# Patient Record
Sex: Female | Born: 1975 | Hispanic: Yes | Marital: Married | Smoking: Never smoker
Health system: Southern US, Community
[De-identification: ages and names within clinical notes are randomized; demographics above are authoritative.]

## PROBLEM LIST (undated history)

## (undated) DIAGNOSIS — Z789 Other specified health status: Secondary | ICD-10-CM

## (undated) DIAGNOSIS — L910 Hypertrophic scar: Secondary | ICD-10-CM

## (undated) DIAGNOSIS — O165 Unspecified maternal hypertension, complicating the puerperium: Secondary | ICD-10-CM

## (undated) HISTORY — DX: Hypertrophic scar: L91.0

## (undated) HISTORY — DX: Unspecified maternal hypertension, complicating the puerperium: O16.5

## (undated) HISTORY — DX: Other specified health status: Z78.9

---

## 2019-08-29 LAB — OB RESULTS CONSOLE ABO/RH: RH Type: POSITIVE

## 2019-08-29 LAB — HIV ANTIBODY (ROUTINE TESTING W REFLEX): HIV Screen 4th Generation wRfx: NONREACTIVE

## 2019-08-29 LAB — OB RESULTS CONSOLE RUBELLA ANTIBODY, IGM: Rubella: IMMUNE

## 2019-08-29 LAB — OB RESULTS CONSOLE HEPATITIS B SURFACE ANTIGEN: Hepatitis B Surface Ag: NEGATIVE

## 2019-08-29 LAB — OB RESULTS CONSOLE RPR: RPR: NONREACTIVE

## 2019-08-29 LAB — OB RESULTS CONSOLE GC/CHLAMYDIA
Chlamydia: NEGATIVE
Gonorrhea: NEGATIVE

## 2019-08-29 LAB — HEPATITIS C ANTIBODY: HCV Ab: NEGATIVE

## 2019-08-29 LAB — OB RESULTS CONSOLE ANTIBODY SCREEN: Antibody Screen: NEGATIVE

## 2020-01-30 ENCOUNTER — Encounter: Payer: Self-pay | Admitting: Radiology

## 2020-02-05 ENCOUNTER — Other Ambulatory Visit (HOSPITAL_COMMUNITY)
Admission: RE | Admit: 2020-02-05 | Discharge: 2020-02-05 | Disposition: A | Discharge status: Home / Self Care | Source: Ambulatory Visit | Attending: Family Medicine | Admitting: Family Medicine

## 2020-02-05 ENCOUNTER — Other Ambulatory Visit: Payer: Self-pay

## 2020-02-05 ENCOUNTER — Encounter: Payer: Self-pay | Admitting: Family Medicine

## 2020-02-05 ENCOUNTER — Ambulatory Visit (INDEPENDENT_AMBULATORY_CARE_PROVIDER_SITE_OTHER): Admitting: Family Medicine

## 2020-02-05 DIAGNOSIS — O09529 Supervision of elderly multigravida, unspecified trimester: Secondary | ICD-10-CM | POA: Insufficient documentation

## 2020-02-05 DIAGNOSIS — O0990 Supervision of high risk pregnancy, unspecified, unspecified trimester: Secondary | ICD-10-CM | POA: Insufficient documentation

## 2020-02-05 DIAGNOSIS — O09523 Supervision of elderly multigravida, third trimester: Secondary | ICD-10-CM

## 2020-02-05 DIAGNOSIS — D573 Sickle-cell trait: Secondary | ICD-10-CM | POA: Insufficient documentation

## 2020-02-05 DIAGNOSIS — O0993 Supervision of high risk pregnancy, unspecified, third trimester: Secondary | ICD-10-CM | POA: Diagnosis not present

## 2020-02-05 DIAGNOSIS — Z3A36 36 weeks gestation of pregnancy: Secondary | ICD-10-CM

## 2020-02-05 DIAGNOSIS — O34219 Maternal care for unspecified type scar from previous cesarean delivery: Secondary | ICD-10-CM | POA: Insufficient documentation

## 2020-02-05 NOTE — Progress Notes (Signed)
Desires repeat c section  Last saw OB on July 22nd Normal pap on 08/21/2019

## 2020-02-05 NOTE — Progress Notes (Signed)
° °  PRENATAL VISIT NOTE  Subjective:  Brianna Chapman is a 44 y.o. G3P1011 at [redacted]w[redacted]d being seen today for transferring prenatal care from [place].  She is currently monitored for the following issues for this high-risk pregnancy and has Supervision of high risk pregnancy, antepartum; Previous cesarean delivery affecting pregnancy, antepartum; and AMA (advanced maternal age) multigravida 35+ on their problem list.  Patient reports no complaints.  Contractions: Irritability. Vag. Bleeding: None.  Movement: Present. Denies leaking of fluid.   The following portions of the patient's history were reviewed and updated as appropriate: allergies, current medications, past family history, past medical history, past social history, past surgical history and problem list.   Objective:   Vitals:   02/05/20 1603 02/05/20 1608  BP: 101/64   Pulse: 71   Weight: 163 lb (73.9 kg)   Height:  5\' 4"  (1.626 m)    Fetal Status: Fetal Heart Rate (bpm): 141 Fundal Height: 32 cm Movement: Present  Presentation: Vertex  General:  Alert, oriented and cooperative. Patient is in no acute distress.  Skin: Skin is warm and dry. No rash noted.   Cardiovascular: Normal heart rate noted  Respiratory: Normal respiratory effort, no problems with respiration noted  Abdomen: Soft, gravid, appropriate for gestational age.  Pain/Pressure: Present     Pelvic: Cervical exam performed in the presence of a chaperone Dilation: Fingertip Effacement (%): 40 Station: -2  Extremities: Normal range of motion.  Edema: None  Mental Status: Normal mood and affect. Normal behavior. Normal judgment and thought content.   Assessment and Plan:  Pregnancy: G3P1011 at [redacted]w[redacted]d 1. Supervision of high risk pregnancy, antepartum Has no had care since 11/2019, no glucose screen--check A1C Cultures today Declined flu and COVID vaccines - Hemoglobin A1c - Strep Gp B NAA - Cervicovaginal ancillary only( [place])  2. Previous cesarean delivery  affecting pregnancy, antepartum Booked for RCS--unsure about contraception or pregnancy desires  3. Multigravida of advanced maternal age in third trimester nml NIPT  Preterm labor symptoms and general obstetric precautions including but not limited to vaginal bleeding, contractions, leaking of fluid and fetal movement were reviewed in detail with the patient. Please refer to After Visit Summary for other counseling recommendations.   No follow-ups on file.  Future Appointments  Date Time Provider Department Center  02/12/2020 11:15 AM 02/14/2020, MD [facility]    Reva Bores, MD

## 2020-02-06 ENCOUNTER — Other Ambulatory Visit: Payer: Self-pay | Admitting: Obstetrics and Gynecology

## 2020-02-06 ENCOUNTER — Telehealth: Payer: Self-pay

## 2020-02-06 LAB — HEMOGLOBIN A1C
Est. average glucose Bld gHb Est-mCnc: 120 mg/dL
Hgb A1c MFr Bld: 5.8 % — ABNORMAL HIGH (ref 4.8–5.6)

## 2020-02-06 NOTE — Telephone Encounter (Signed)
-----   Message from Reva Bores, MD sent at 02/06/2020  7:21 AM EDT ----- Her A1C is high--can we get her in for 2 hour asap

## 2020-02-06 NOTE — Telephone Encounter (Signed)
Pt. Informed of hemoglobin a1c. Pt. Scheduled for 2 hr GTT per Dr. Shawnie Pons. Pt. Voiced understanding.

## 2020-02-07 ENCOUNTER — Other Ambulatory Visit

## 2020-02-07 ENCOUNTER — Other Ambulatory Visit: Payer: Self-pay

## 2020-02-07 DIAGNOSIS — O0990 Supervision of high risk pregnancy, unspecified, unspecified trimester: Secondary | ICD-10-CM

## 2020-02-07 LAB — CERVICOVAGINAL ANCILLARY ONLY
Chlamydia: NEGATIVE
Comment: NEGATIVE
Comment: NORMAL
Neisseria Gonorrhea: NEGATIVE

## 2020-02-07 LAB — CBC
Hematocrit: 30.8 % — ABNORMAL LOW (ref 34.0–46.6)
Hemoglobin: 9.3 g/dL — ABNORMAL LOW (ref 11.1–15.9)
MCH: 22.9 pg — ABNORMAL LOW (ref 26.6–33.0)
MCHC: 30.2 g/dL — ABNORMAL LOW (ref 31.5–35.7)
MCV: 76 fL — ABNORMAL LOW (ref 79–97)
Platelets: 177 10*3/uL (ref 150–450)
RBC: 4.06 x10E6/uL (ref 3.77–5.28)
RDW: 16.9 % — ABNORMAL HIGH (ref 11.7–15.4)
WBC: 10 10*3/uL (ref 3.4–10.8)

## 2020-02-07 LAB — STREP GP B NAA: Strep Gp B NAA: NEGATIVE

## 2020-02-08 ENCOUNTER — Telehealth: Payer: Self-pay | Admitting: *Deleted

## 2020-02-08 LAB — HIV ANTIBODY (ROUTINE TESTING W REFLEX): HIV Screen 4th Generation wRfx: NONREACTIVE

## 2020-02-08 LAB — GLUCOSE TOLERANCE, 2 HOURS W/ 1HR
Glucose, 1 hour: 138 mg/dL (ref 65–179)
Glucose, 2 hour: 125 mg/dL (ref 65–152)
Glucose, Fasting: 81 mg/dL (ref 65–91)

## 2020-02-08 LAB — RPR: RPR Ser Ql: NONREACTIVE

## 2020-02-08 NOTE — Telephone Encounter (Signed)
Called pt to inform her of her lab results and recommendations from Dr Shawnie Pons. Pt verbalizes and understands.

## 2020-02-08 NOTE — Telephone Encounter (Signed)
-----   Message from Reva Bores, MD sent at 02/08/2020  8:41 AM EDT ----- Passed her sugar test, and needs iron for anemia--no pharmacy--take one 325 ferrous sulfate every other day

## 2020-02-11 ENCOUNTER — Encounter (HOSPITAL_COMMUNITY): Payer: Self-pay

## 2020-02-11 NOTE — Patient Instructions (Signed)
Brianna Chapman  02/11/2020   Your procedure is scheduled on:  02/25/2020  Arrive at 0745 at Entrance C on [address] at [facility]  and [employer]. You are invited to use the FREE valet parking or use the Visitor's parking deck.  Pick up the phone at the desk and dial [phone].  Call this number if you have problems the morning of surgery: [phone]  Remember:   Do not eat food:(After Midnight) Desps de medianoche.  Do not drink clear liquids: (After Midnight) Desps de medianoche.  Take these medicines the morning of surgery with A SIP OF WATER:  none   Do not wear jewelry, make-up or nail polish.  Do not wear lotions, powders, or perfumes. Do not wear deodorant.  Do not shave 48 hours prior to surgery.  Do not bring valuables to the hospital.  [facility] is not   responsible for any belongings or valuables brought to the hospital.  Contacts, dentures or bridgework may not be worn into surgery.  Leave suitcase in the car. After surgery it may be brought to your room.  For patients admitted to the hospital, checkout time is 11:00 AM the day of              discharge.      Please read over the following fact sheets that you were given:     Preparing for Surgery

## 2020-02-12 ENCOUNTER — Ambulatory Visit (INDEPENDENT_AMBULATORY_CARE_PROVIDER_SITE_OTHER): Admitting: Family Medicine

## 2020-02-12 ENCOUNTER — Other Ambulatory Visit: Payer: Self-pay

## 2020-02-12 VITALS — BP 110/67 | HR 68 | Wt 166.4 lb

## 2020-02-12 DIAGNOSIS — O34219 Maternal care for unspecified type scar from previous cesarean delivery: Secondary | ICD-10-CM

## 2020-02-12 DIAGNOSIS — O0990 Supervision of high risk pregnancy, unspecified, unspecified trimester: Secondary | ICD-10-CM

## 2020-02-12 DIAGNOSIS — O09523 Supervision of elderly multigravida, third trimester: Secondary | ICD-10-CM | POA: Diagnosis not present

## 2020-02-12 DIAGNOSIS — O0993 Supervision of high risk pregnancy, unspecified, third trimester: Secondary | ICD-10-CM | POA: Diagnosis not present

## 2020-02-12 DIAGNOSIS — Z3A37 37 weeks gestation of pregnancy: Secondary | ICD-10-CM | POA: Diagnosis not present

## 2020-02-12 NOTE — Patient Instructions (Signed)

## 2020-02-13 NOTE — Progress Notes (Signed)
   PRENATAL VISIT NOTE  Subjective:  Brianna Chapman is a 44 y.o. G3P1011 at [redacted]w[redacted]d being seen today for ongoing prenatal care.  She is currently monitored for the following issues for this high-risk pregnancy and has Supervision of high risk pregnancy, antepartum; Previous cesarean delivery affecting pregnancy, antepartum; AMA (advanced maternal age) multigravida 35+; and Sickle cell trait (HCC) on their problem list.  Patient reports no complaints.  Contractions: Not present. Vag. Bleeding: None.  Movement: Present. Denies leaking of fluid.   The following portions of the patient's history were reviewed and updated as appropriate: allergies, current medications, past family history, past medical history, past social history, past surgical history and problem list.   Objective:   Vitals:   02/12/20 1129  BP: 110/67  Pulse: 68  Weight: 166 lb 6.4 oz (75.5 kg)    Fetal Status: Fetal Heart Rate (bpm): 136 Fundal Height: 34 cm Movement: Present     General:  Alert, oriented and cooperative. Patient is in no acute distress.  Skin: Skin is warm and dry. No rash noted.   Cardiovascular: Normal heart rate noted  Respiratory: Normal respiratory effort, no problems with respiration noted  Abdomen: Soft, gravid, appropriate for gestational age.  Pain/Pressure: Absent     Pelvic: Cervical exam deferred        Extremities: Normal range of motion.  Edema: None  Mental Status: Normal mood and affect. Normal behavior. Normal judgment and thought content.   Assessment and Plan:  Pregnancy: G3P1011 at [redacted]w[redacted]d 1. Multigravida of advanced maternal age in third trimester Normal NIPT  2. Previous cesarean delivery affecting pregnancy, antepartum Scheduled for RCS  3. Supervision of high risk pregnancy, antepartum Continue routine prenatal care.  Term labor symptoms and general obstetric precautions including but not limited to vaginal bleeding, contractions, leaking of fluid and fetal movement were  reviewed in detail with the patient. Please refer to After Visit Summary for other counseling recommendations.   Return in 1 week (on 02/19/2020).  Future Appointments  Date Time Provider Department Center  02/19/2020  2:15 PM Anyanwu, Jethro Bastos, MD [facility]  02/23/2020  8:20 AM [facility] 1 [facility] None    Reva Bores, MD

## 2020-02-19 ENCOUNTER — Other Ambulatory Visit: Payer: Self-pay

## 2020-02-19 ENCOUNTER — Ambulatory Visit (INDEPENDENT_AMBULATORY_CARE_PROVIDER_SITE_OTHER): Admitting: Obstetrics & Gynecology

## 2020-02-19 ENCOUNTER — Encounter: Payer: Self-pay | Admitting: Obstetrics & Gynecology

## 2020-02-19 VITALS — BP 97/60 | HR 67 | Wt 165.8 lb

## 2020-02-19 DIAGNOSIS — Z3A38 38 weeks gestation of pregnancy: Secondary | ICD-10-CM

## 2020-02-19 DIAGNOSIS — O34219 Maternal care for unspecified type scar from previous cesarean delivery: Secondary | ICD-10-CM

## 2020-02-19 DIAGNOSIS — O09523 Supervision of elderly multigravida, third trimester: Secondary | ICD-10-CM | POA: Diagnosis not present

## 2020-02-19 DIAGNOSIS — O0993 Supervision of high risk pregnancy, unspecified, third trimester: Secondary | ICD-10-CM

## 2020-02-19 DIAGNOSIS — O0990 Supervision of high risk pregnancy, unspecified, unspecified trimester: Secondary | ICD-10-CM

## 2020-02-19 NOTE — Patient Instructions (Signed)
Return to office for any scheduled appointments. Call the office or go to the MAU at [facility] if:  You begin to have strong, frequent contractions  Your water breaks.  Sometimes it is a big gush of fluid, sometimes it is just a trickle that keeps getting your panties wet or running down your legs  You have vaginal bleeding.  It is normal to have a small amount of spotting if your cervix was checked.   You do not feel your baby moving like normal.  If you do not, get something to eat and drink and lay down and focus on feeling your baby move.   If your baby is still not moving like normal, you should call the office or go to MAU.  Any other obstetric concerns.   

## 2020-02-19 NOTE — Progress Notes (Signed)
   PRENATAL VISIT NOTE  Subjective:  Brianna Chapman is a 44 y.o. G3P1011 at [redacted]w[redacted]d being seen today for ongoing prenatal care.  She is currently monitored for the following issues for this high-risk pregnancy and has Supervision of high risk pregnancy, antepartum; Previous cesarean delivery affecting pregnancy, antepartum; AMA (advanced maternal age) multigravida 35+; and Sickle cell trait (HCC) on their problem list.  Patient reports no complaints.  Contractions: Not present. Vag. Bleeding: None.  Movement: Present. Denies leaking of fluid.   The following portions of the patient's history were reviewed and updated as appropriate: allergies, current medications, past family history, past medical history, past social history, past surgical history and problem list.   Objective:   Vitals:   02/19/20 1427  BP: 97/60  Pulse: 67  Weight: 165 lb 12.8 oz (75.2 kg)    Fetal Status: Fetal Heart Rate (bpm): 140 Fundal Height: 36 cm Movement: Present     General:  Alert, oriented and cooperative. Patient is in no acute distress.  Skin: Skin is warm and dry. No rash noted.   Cardiovascular: Normal heart rate noted  Respiratory: Normal respiratory effort, no problems with respiration noted  Abdomen: Soft, gravid, appropriate for gestational age.  Pain/Pressure: Absent     Pelvic: Cervical exam deferred        Extremities: Normal range of motion.  Edema: None  Mental Status: Normal mood and affect. Normal behavior. Normal judgment and thought content.   Assessment and Plan:  Pregnancy: G3P1011 at [redacted]w[redacted]d 1. Multigravida of advanced maternal age in third trimester Antenatal testing today.  NST performed today was reviewed and was found to be reactive.  AFI was also normal.   - Fetal nonstress test - US OB Limited; Future  2. Previous cesarean delivery affecting pregnancy, antepartum Already scheduled for RCS at 39 weeks  3. [redacted] weeks gestation of pregnancy 4. Supervision of high risk  pregnancy, antepartum No other concerns.  Term labor symptoms and general obstetric precautions including but not limited to vaginal bleeding, contractions, leaking of fluid and fetal movement were reviewed in detail with the patient. Please refer to After Visit Summary for other counseling recommendations.   Return in about 6 weeks (around 04/01/2020) for Postpartum check.  Future Appointments  Date Time Provider Department Center  02/23/2020  8:20 AM [facility] 1 [facility] None    Jaynie Collins, MD

## 2020-02-23 ENCOUNTER — Ambulatory Visit (HOSPITAL_COMMUNITY)
Admission: RE | Admit: 2020-02-23 | Discharge: 2020-02-23 | Disposition: A | Discharge status: Home / Self Care | Source: Ambulatory Visit | Attending: Obstetrics and Gynecology | Admitting: Obstetrics and Gynecology

## 2020-02-23 ENCOUNTER — Other Ambulatory Visit: Payer: Self-pay

## 2020-02-23 DIAGNOSIS — Z20822 Contact with and (suspected) exposure to covid-19: Secondary | ICD-10-CM | POA: Insufficient documentation

## 2020-02-23 LAB — CBC
HCT: 27.2 % — ABNORMAL LOW (ref 36.0–46.0)
Hemoglobin: 8.3 g/dL — ABNORMAL LOW (ref 12.0–15.0)
MCH: 22.2 pg — ABNORMAL LOW (ref 26.0–34.0)
MCHC: 30.5 g/dL (ref 30.0–36.0)
MCV: 72.7 fL — ABNORMAL LOW (ref 80.0–100.0)
Platelets: 168 10*3/uL (ref 150–400)
RBC: 3.74 MIL/uL — ABNORMAL LOW (ref 3.87–5.11)
RDW: 17.3 % — ABNORMAL HIGH (ref 11.5–15.5)
WBC: 8.5 10*3/uL (ref 4.0–10.5)
nRBC: 0 % (ref 0.0–0.2)

## 2020-02-23 LAB — RESPIRATORY PANEL BY RT PCR (FLU A&B, COVID)
Influenza A by PCR: NEGATIVE
Influenza B by PCR: NEGATIVE
SARS Coronavirus 2 by RT PCR: NEGATIVE

## 2020-02-23 LAB — RPR: RPR Ser Ql: NONREACTIVE

## 2020-02-25 ENCOUNTER — Encounter (HOSPITAL_COMMUNITY)
Admission: RE | Disposition: A | Discharge status: Home / Self Care | Payer: Self-pay | Source: Home / Self Care | Attending: Obstetrics and Gynecology

## 2020-02-25 ENCOUNTER — Inpatient Hospital Stay (HOSPITAL_COMMUNITY)
Admission: RE | Admit: 2020-02-25 | Discharge: 2020-02-27 | DRG: 788 | Disposition: A | Discharge status: Home / Self Care | Attending: Obstetrics and Gynecology | Admitting: Obstetrics and Gynecology

## 2020-02-25 ENCOUNTER — Encounter (HOSPITAL_COMMUNITY): Payer: Self-pay | Admitting: Obstetrics and Gynecology

## 2020-02-25 ENCOUNTER — Other Ambulatory Visit: Payer: Self-pay

## 2020-02-25 ENCOUNTER — Inpatient Hospital Stay (HOSPITAL_COMMUNITY): Admitting: Certified Registered Nurse Anesthetist

## 2020-02-25 ENCOUNTER — Telehealth: Payer: Self-pay | Admitting: Radiology

## 2020-02-25 DIAGNOSIS — Z3A39 39 weeks gestation of pregnancy: Secondary | ICD-10-CM

## 2020-02-25 DIAGNOSIS — O34211 Maternal care for low transverse scar from previous cesarean delivery: Secondary | ICD-10-CM | POA: Diagnosis present

## 2020-02-25 DIAGNOSIS — D573 Sickle-cell trait: Secondary | ICD-10-CM | POA: Diagnosis present

## 2020-02-25 DIAGNOSIS — O9902 Anemia complicating childbirth: Secondary | ICD-10-CM | POA: Diagnosis present

## 2020-02-25 DIAGNOSIS — Z20822 Contact with and (suspected) exposure to covid-19: Secondary | ICD-10-CM | POA: Diagnosis present

## 2020-02-25 DIAGNOSIS — Z98891 History of uterine scar from previous surgery: Secondary | ICD-10-CM

## 2020-02-25 DIAGNOSIS — O34219 Maternal care for unspecified type scar from previous cesarean delivery: Secondary | ICD-10-CM

## 2020-02-25 DIAGNOSIS — O09529 Supervision of elderly multigravida, unspecified trimester: Secondary | ICD-10-CM

## 2020-02-25 LAB — PREPARE RBC (CROSSMATCH)

## 2020-02-25 LAB — ABO/RH: ABO/RH(D): A POS

## 2020-02-25 SURGERY — Surgical Case
Anesthesia: Spinal | Site: Abdomen | Wound class: Clean Contaminated

## 2020-02-25 MED ORDER — NALBUPHINE HCL 10 MG/ML IJ SOLN
5.0000 mg | INTRAMUSCULAR | Status: DC | PRN
  Administered 2020-02-25: 5 mg via INTRAVENOUS
  Filled 2020-02-25: qty 1

## 2020-02-25 MED ORDER — DIPHENHYDRAMINE HCL 25 MG PO CAPS: 25.0000 mg | ORAL_CAPSULE | ORAL | Status: DC | PRN

## 2020-02-25 MED ORDER — TETANUS-DIPHTH-ACELL PERTUSSIS 5-2.5-18.5 LF-MCG/0.5 IM SUSP: 0.5000 mL | Freq: Once | INTRAMUSCULAR | Status: DC

## 2020-02-25 MED ORDER — KETOROLAC TROMETHAMINE 30 MG/ML IJ SOLN: 30.0000 mg | Freq: Four times a day (QID) | INTRAMUSCULAR | Status: DC | PRN

## 2020-02-25 MED ORDER — COCONUT OIL OIL
1.0000 "application " | TOPICAL_OIL | Status: DC | PRN
  Administered 2020-02-27: 1 via TOPICAL

## 2020-02-25 MED ORDER — DEXAMETHASONE SODIUM PHOSPHATE 4 MG/ML IJ SOLN
INTRAMUSCULAR | Status: AC
  Filled 2020-02-25: qty 1

## 2020-02-25 MED ORDER — OXYCODONE HCL 5 MG/5ML PO SOLN: 5.0000 mg | Freq: Once | ORAL | Status: DC | PRN

## 2020-02-25 MED ORDER — CEFAZOLIN SODIUM-DEXTROSE 2-4 GM/100ML-% IV SOLN
INTRAVENOUS | Status: AC
  Filled 2020-02-25: qty 100

## 2020-02-25 MED ORDER — SIMETHICONE 80 MG PO CHEW
80.0000 mg | CHEWABLE_TABLET | Freq: Three times a day (TID) | ORAL | Status: DC
  Administered 2020-02-25 – 2020-02-27 (×6): 80 mg via ORAL
  Filled 2020-02-25 (×5): qty 1

## 2020-02-25 MED ORDER — STERILE WATER FOR IRRIGATION IR SOLN
Status: DC | PRN
  Administered 2020-02-25: 1000 mL

## 2020-02-25 MED ORDER — SODIUM CHLORIDE 0.9 % IR SOLN
Status: DC | PRN
  Administered 2020-02-25: 1000 mL

## 2020-02-25 MED ORDER — BUPIVACAINE IN DEXTROSE 0.75-8.25 % IT SOLN
INTRATHECAL | Status: DC | PRN
  Administered 2020-02-25: 1.6 mL via INTRATHECAL

## 2020-02-25 MED ORDER — MORPHINE SULFATE (PF) 0.5 MG/ML IJ SOLN
INTRAMUSCULAR | Status: DC | PRN
Start: 2020-02-25 — End: 2020-02-25
  Administered 2020-02-25: .15 mg via INTRATHECAL

## 2020-02-25 MED ORDER — ONDANSETRON HCL 4 MG/2ML IJ SOLN
INTRAMUSCULAR | Status: AC
  Filled 2020-02-25: qty 2

## 2020-02-25 MED ORDER — WITCH HAZEL-GLYCERIN EX PADS: 1.0000 "application " | MEDICATED_PAD | CUTANEOUS | Status: DC | PRN

## 2020-02-25 MED ORDER — POVIDONE-IODINE 10 % EX SWAB
2.0000 "application " | Freq: Once | CUTANEOUS | Status: AC
  Administered 2020-02-25: 2 via TOPICAL

## 2020-02-25 MED ORDER — PHENYLEPHRINE HCL-NACL 20-0.9 MG/250ML-% IV SOLN
INTRAVENOUS | Status: DC | PRN
  Administered 2020-02-25: 60 ug/min via INTRAVENOUS

## 2020-02-25 MED ORDER — SENNOSIDES-DOCUSATE SODIUM 8.6-50 MG PO TABS
2.0000 | ORAL_TABLET | ORAL | Status: DC
  Administered 2020-02-25: 2 via ORAL
  Filled 2020-02-25: qty 2

## 2020-02-25 MED ORDER — OXYTOCIN-SODIUM CHLORIDE 30-0.9 UT/500ML-% IV SOLN
INTRAVENOUS | Status: DC | PRN
  Administered 2020-02-25: 30 [IU] via INTRAVENOUS

## 2020-02-25 MED ORDER — PHENYLEPHRINE HCL-NACL 20-0.9 MG/250ML-% IV SOLN
INTRAVENOUS | Status: AC
  Filled 2020-02-25: qty 250

## 2020-02-25 MED ORDER — MORPHINE SULFATE (PF) 0.5 MG/ML IJ SOLN
INTRAMUSCULAR | Status: AC
  Filled 2020-02-25: qty 10

## 2020-02-25 MED ORDER — NALOXONE HCL 4 MG/10ML IJ SOLN
1.0000 ug/kg/h | INTRAVENOUS | Status: DC | PRN
  Filled 2020-02-25: qty 5

## 2020-02-25 MED ORDER — LACTATED RINGERS IV SOLN: INTRAVENOUS | Status: DC | PRN

## 2020-02-25 MED ORDER — SODIUM CHLORIDE 0.9% FLUSH: 3.0000 mL | INTRAVENOUS | Status: DC | PRN

## 2020-02-25 MED ORDER — KETOROLAC TROMETHAMINE 30 MG/ML IJ SOLN
INTRAMUSCULAR | Status: AC
  Filled 2020-02-25: qty 1

## 2020-02-25 MED ORDER — NALOXONE HCL 0.4 MG/ML IJ SOLN: 0.4000 mg | INTRAMUSCULAR | Status: DC | PRN

## 2020-02-25 MED ORDER — DEXAMETHASONE SODIUM PHOSPHATE 4 MG/ML IJ SOLN
INTRAMUSCULAR | Status: DC | PRN
  Administered 2020-02-25: 4 mg via INTRAVENOUS

## 2020-02-25 MED ORDER — FENTANYL CITRATE (PF) 100 MCG/2ML IJ SOLN
INTRAMUSCULAR | Status: AC
  Filled 2020-02-25: qty 2

## 2020-02-25 MED ORDER — MEPERIDINE HCL 25 MG/ML IJ SOLN: 6.2500 mg | INTRAMUSCULAR | Status: DC | PRN

## 2020-02-25 MED ORDER — TRANEXAMIC ACID-NACL 1000-0.7 MG/100ML-% IV SOLN
INTRAVENOUS | Status: AC
  Filled 2020-02-25: qty 100

## 2020-02-25 MED ORDER — ONDANSETRON HCL 4 MG/2ML IJ SOLN: 4.0000 mg | Freq: Once | INTRAMUSCULAR | Status: DC | PRN

## 2020-02-25 MED ORDER — PRENATAL MULTIVITAMIN CH
1.0000 | ORAL_TABLET | Freq: Every day | ORAL | Status: DC
  Administered 2020-02-26 – 2020-02-27 (×2): 1 via ORAL
  Filled 2020-02-25 (×2): qty 1

## 2020-02-25 MED ORDER — SODIUM CHLORIDE 0.9 % IV SOLN: INTRAVENOUS | Status: DC | PRN

## 2020-02-25 MED ORDER — LACTATED RINGERS IV SOLN: INTRAVENOUS | Status: DC

## 2020-02-25 MED ORDER — NALBUPHINE HCL 10 MG/ML IJ SOLN: 5.0000 mg | Freq: Once | INTRAMUSCULAR | Status: DC | PRN

## 2020-02-25 MED ORDER — DIPHENHYDRAMINE HCL 50 MG/ML IJ SOLN: 12.5000 mg | INTRAMUSCULAR | Status: DC | PRN

## 2020-02-25 MED ORDER — SCOPOLAMINE 1 MG/3DAYS TD PT72
1.0000 | MEDICATED_PATCH | Freq: Once | TRANSDERMAL | Status: DC
  Administered 2020-02-25: 1.5 mg via TRANSDERMAL

## 2020-02-25 MED ORDER — DIBUCAINE (PERIANAL) 1 % EX OINT: 1.0000 "application " | TOPICAL_OINTMENT | CUTANEOUS | Status: DC | PRN

## 2020-02-25 MED ORDER — SIMETHICONE 80 MG PO CHEW
80.0000 mg | CHEWABLE_TABLET | ORAL | Status: DC
  Administered 2020-02-27: 80 mg via ORAL
  Filled 2020-02-25 (×2): qty 1

## 2020-02-25 MED ORDER — ACETAMINOPHEN 325 MG PO TABS: 325.0000 mg | ORAL_TABLET | ORAL | Status: DC | PRN

## 2020-02-25 MED ORDER — DIPHENHYDRAMINE HCL 25 MG PO CAPS
25.0000 mg | ORAL_CAPSULE | Freq: Four times a day (QID) | ORAL | Status: DC | PRN
  Administered 2020-02-26 (×2): 25 mg via ORAL
  Filled 2020-02-25 (×2): qty 1

## 2020-02-25 MED ORDER — MENTHOL 3 MG MT LOZG: 1.0000 | LOZENGE | OROMUCOSAL | Status: DC | PRN

## 2020-02-25 MED ORDER — FENTANYL CITRATE (PF) 100 MCG/2ML IJ SOLN
INTRAMUSCULAR | Status: DC | PRN
Start: 2020-02-25 — End: 2020-02-25
  Administered 2020-02-25: 15 ug via INTRATHECAL

## 2020-02-25 MED ORDER — OXYCODONE HCL 5 MG PO TABS: 5.0000 mg | ORAL_TABLET | Freq: Once | ORAL | Status: DC | PRN

## 2020-02-25 MED ORDER — SODIUM CHLORIDE 0.9% IV SOLUTION: Freq: Once | INTRAVENOUS | Status: DC

## 2020-02-25 MED ORDER — ONDANSETRON HCL 4 MG/2ML IJ SOLN
4.0000 mg | Freq: Three times a day (TID) | INTRAMUSCULAR | Status: DC | PRN
  Filled 2020-02-25: qty 2

## 2020-02-25 MED ORDER — NALBUPHINE HCL 10 MG/ML IJ SOLN: 5.0000 mg | INTRAMUSCULAR | Status: DC | PRN

## 2020-02-25 MED ORDER — ACETAMINOPHEN 500 MG PO TABS
1000.0000 mg | ORAL_TABLET | Freq: Four times a day (QID) | ORAL | Status: DC
  Administered 2020-02-25 – 2020-02-27 (×5): 1000 mg via ORAL
  Filled 2020-02-25 (×7): qty 2

## 2020-02-25 MED ORDER — SCOPOLAMINE 1 MG/3DAYS TD PT72
MEDICATED_PATCH | TRANSDERMAL | Status: AC
  Filled 2020-02-25: qty 1

## 2020-02-25 MED ORDER — ZOLPIDEM TARTRATE 5 MG PO TABS: 5.0000 mg | ORAL_TABLET | Freq: Every evening | ORAL | Status: DC | PRN

## 2020-02-25 MED ORDER — TRANEXAMIC ACID-NACL 1000-0.7 MG/100ML-% IV SOLN
INTRAVENOUS | Status: DC | PRN
  Administered 2020-02-25: 1000 mg via INTRAVENOUS

## 2020-02-25 MED ORDER — SIMETHICONE 80 MG PO CHEW: 80.0000 mg | CHEWABLE_TABLET | ORAL | Status: DC | PRN

## 2020-02-25 MED ORDER — OXYCODONE HCL 5 MG PO TABS
5.0000 mg | ORAL_TABLET | ORAL | Status: DC | PRN
  Administered 2020-02-27: 5 mg via ORAL
  Filled 2020-02-25: qty 1

## 2020-02-25 MED ORDER — OXYTOCIN-SODIUM CHLORIDE 30-0.9 UT/500ML-% IV SOLN
INTRAVENOUS | Status: AC
  Filled 2020-02-25: qty 500

## 2020-02-25 MED ORDER — FENTANYL CITRATE (PF) 100 MCG/2ML IJ SOLN: 25.0000 ug | INTRAMUSCULAR | Status: DC | PRN

## 2020-02-25 MED ORDER — IBUPROFEN 600 MG PO TABS
600.0000 mg | ORAL_TABLET | Freq: Four times a day (QID) | ORAL | Status: DC | PRN
  Administered 2020-02-25 – 2020-02-27 (×5): 600 mg via ORAL
  Filled 2020-02-25 (×5): qty 1

## 2020-02-25 MED ORDER — KETOROLAC TROMETHAMINE 30 MG/ML IJ SOLN
30.0000 mg | Freq: Four times a day (QID) | INTRAMUSCULAR | Status: DC | PRN
  Administered 2020-02-25: 30 mg via INTRAVENOUS

## 2020-02-25 MED ORDER — OXYTOCIN-SODIUM CHLORIDE 30-0.9 UT/500ML-% IV SOLN: 2.5000 [IU]/h | INTRAVENOUS | Status: AC

## 2020-02-25 MED ORDER — ACETAMINOPHEN 160 MG/5ML PO SOLN: 325.0000 mg | ORAL | Status: DC | PRN

## 2020-02-25 MED ORDER — ONDANSETRON HCL 4 MG/2ML IJ SOLN
INTRAMUSCULAR | Status: DC | PRN
  Administered 2020-02-25: 4 mg via INTRAVENOUS

## 2020-02-25 MED ORDER — CEFAZOLIN SODIUM-DEXTROSE 2-4 GM/100ML-% IV SOLN
2.0000 g | INTRAVENOUS | Status: AC
  Administered 2020-02-25: 2 g via INTRAVENOUS

## 2020-02-25 SURGICAL SUPPLY — 37 items
BENZOIN TINCTURE PRP APPL 2/3 (GAUZE/BANDAGES/DRESSINGS) ×3 IMPLANT
CHLORAPREP W/TINT 26ML (MISCELLANEOUS) ×3 IMPLANT
CLAMP CORD UMBIL (MISCELLANEOUS) IMPLANT
CLOSURE WOUND 1/2 X4 (GAUZE/BANDAGES/DRESSINGS) ×1
CLOTH BEACON ORANGE TIMEOUT ST (SAFETY) ×3 IMPLANT
DRSG OPSITE POSTOP 4X10 (GAUZE/BANDAGES/DRESSINGS) ×3 IMPLANT
ELECT REM PT RETURN 9FT ADLT (ELECTROSURGICAL) ×3
ELECTRODE REM PT RTRN 9FT ADLT (ELECTROSURGICAL) ×1 IMPLANT
EXTRACTOR VACUUM M CUP 4 TUBE (SUCTIONS) IMPLANT
EXTRACTOR VACUUM M CUP 4' TUBE (SUCTIONS)
GAUZE SPONGE 4X4 12PLY STRL LF (GAUZE/BANDAGES/DRESSINGS) ×6 IMPLANT
GLOVE BIOGEL PI IND STRL 7.0 (GLOVE) ×2 IMPLANT
GLOVE BIOGEL PI IND STRL 7.5 (GLOVE) ×2 IMPLANT
GLOVE BIOGEL PI INDICATOR 7.0 (GLOVE) ×4
GLOVE BIOGEL PI INDICATOR 7.5 (GLOVE) ×4
GLOVE ECLIPSE 7.5 STRL STRAW (GLOVE) ×3 IMPLANT
GOWN STRL REUS W/TWL LRG LVL3 (GOWN DISPOSABLE) ×9 IMPLANT
KIT ABG SYR 3ML LUER SLIP (SYRINGE) IMPLANT
NEEDLE HYPO 25X5/8 SAFETYGLIDE (NEEDLE) IMPLANT
NS IRRIG 1000ML POUR BTL (IV SOLUTION) ×3 IMPLANT
PACK C SECTION WH (CUSTOM PROCEDURE TRAY) ×3 IMPLANT
PAD ABD 7.5X8 STRL (GAUZE/BANDAGES/DRESSINGS) ×3 IMPLANT
PAD OB MATERNITY 4.3X12.25 (PERSONAL CARE ITEMS) ×3 IMPLANT
PENCIL SMOKE EVAC W/HOLSTER (ELECTROSURGICAL) ×3 IMPLANT
RTRCTR C-SECT PINK 25CM LRG (MISCELLANEOUS) ×3 IMPLANT
SPONGE GAUZE 4X4 12PLY STER LF (GAUZE/BANDAGES/DRESSINGS) ×6 IMPLANT
STRIP CLOSURE SKIN 1/2X4 (GAUZE/BANDAGES/DRESSINGS) ×2 IMPLANT
SUT PLAIN 2 0 XLH (SUTURE) ×3 IMPLANT
SUT VIC AB 0 CT1 36 (SUTURE) ×3 IMPLANT
SUT VIC AB 0 CTX 36 (SUTURE) ×4
SUT VIC AB 0 CTX36XBRD ANBCTRL (SUTURE) ×2 IMPLANT
SUT VIC AB 2-0 CT1 27 (SUTURE) ×2
SUT VIC AB 2-0 CT1 TAPERPNT 27 (SUTURE) ×1 IMPLANT
SUT VIC AB 4-0 KS 27 (SUTURE) ×3 IMPLANT
TOWEL OR 17X24 6PK STRL BLUE (TOWEL DISPOSABLE) ×3 IMPLANT
TRAY FOLEY W/BAG SLVR 14FR LF (SET/KITS/TRAYS/PACK) ×3 IMPLANT
WATER STERILE IRR 1000ML POUR (IV SOLUTION) ×3 IMPLANT

## 2020-02-25 NOTE — Discharge Summary (Signed)
Postpartum Discharge Summary       Patient Name: Brianna Chapman DOB: Apr 25, 1976 MRN: [id number]  Date of admission: 02/25/2020 Delivery date:02/25/2020  Delivering provider: Laurey Arrow BEDFORD  Date of discharge: 02/29/2020  Admitting diagnosis: History of cesarean delivery [Z98.891] Status post repeat low transverse cesarean section [Z98.891] Intrauterine pregnancy: [redacted]w[redacted]d     Secondary diagnosis:  Active Problems:   AMA (advanced maternal age) multigravida 35+   History of cesarean delivery   Cesarean delivery delivered   Status post repeat low transverse cesarean section   Additional problems: none   Discharge diagnosis: Term Pregnancy Delivered                                              Post partum procedures:none Augmentation: N/A Complications: None  Hospital course: Sceduled C/S   44 y.o. yo [id number] at [redacted]w[redacted]d was admitted to the hospital 02/25/2020 for scheduled cesarean section with the following indication:Elective Repeat.Delivery details are as follows:  Membrane Rupture Time/Date: 10:04 AM ,02/25/2020   Delivery Method:C-Section, Low Transverse  Details of operation can be found in separate operative note.  Patient had an uncomplicated postpartum course.  She is ambulating, tolerating a regular diet, passing flatus, and urinating well. Patient is discharged home in stable condition on  02/29/20        Newborn Data: Birth date:02/25/2020  Birth time:10:04 AM  Gender:Female  Living status:Living  Apgars:8 ,9  Weight:3135 g     Magnesium Sulfate received: No BMZ received: No Rhophylac:N/A MMR:N/A T-DaP: declined Flu: No Transfusion:No  Physical exam  Vitals:   02/26/20 0844 02/26/20 1432 02/26/20 2055 02/27/20 0534  BP: 96/60 100/61 126/82 116/61  Pulse: (!) 52 (!) 57 (!) 55 (!) 56  Resp: $Remo'20 18 18 18  'YgKTc$ Temp: 98.2 F (36.8 C) 98.1 F (36.7 C) 98.9 F (37.2 C) 98.6 F (37 C)  TempSrc: Oral Oral Oral Oral  SpO2: 97% 99%    Height:        General:Well, no acute distress Lochia: appropriate Uterine Fundus: firm Incision: Dressing clean and intact DVT Evaluation: Homans negative Labs: Lab Results  Component Value Date   WBC 15.2 (H) 02/26/2020   HGB 7.2 (L) 02/26/2020   HCT 22.9 (L) 02/26/2020   MCV 74.1 (L) 02/26/2020   PLT 155 02/26/2020   No flowsheet data found. Edinburgh Score: Edinburgh Postnatal Depression Scale Screening Tool 02/26/2020  I have been able to laugh and see the funny side of things. 0  I have looked forward with enjoyment to things. 0  I have blamed myself unnecessarily when things went wrong. 1  I have been anxious or worried for no good reason. 1  I have felt scared or panicky for no good reason. 1  Things have been getting on top of me. 0  I have been so unhappy that I have had difficulty sleeping. 0  I have felt sad or miserable. 0  I have been so unhappy that I have been crying. 0  The thought of harming myself has occurred to me. 0  Edinburgh Postnatal Depression Scale Total 3     After visit meds:  Allergies as of 02/27/2020   No Known Allergies     Medication List    TAKE these medications   coconut oil Oil Apply 1 application topically as needed.   docusate sodium 100 MG  capsule Commonly known as: Colace Take 1 capsule (100 mg total) by mouth 2 (two) times daily as needed.   ibuprofen 600 MG tablet Commonly known as: ADVIL Take 1 tablet (600 mg total) by mouth every 6 (six) hours as needed for fever or headache.   oxyCODONE-acetaminophen 5-325 MG tablet Commonly known as: Percocet Take 1 tablet by mouth every 6 (six) hours as needed for severe pain.   prenatal multivitamin Tabs tablet Take 1 tablet by mouth daily at 12 noon.            Discharge Care Instructions  (From admission, onward)         Start     Ordered   02/27/20 0000  If the dressing is still on your incision site when you go home, remove it on the third day after your surgery date.  Remove dressing if it begins to fall off, or if it is dirty or damaged before the third day.        02/27/20 0932           Discharge home in stable condition Infant Feeding: Breast Infant Disposition:home with mother Discharge instruction: per After Visit Summary and Postpartum booklet. Activity: Advance as tolerated. Pelvic rest for 6 weeks.  Diet: routine diet Future Appointments: Future Appointments  Date Time Provider [place]  03/03/2020  1:15 PM [facility] NURSE [facility]  04/03/2020  9:30 AM Anyanwu, Sallyanne Havers, MD [facility]   Follow up Visit:  [facility] [place] [facility]. Schedule an appointment as soon as possible for a visit in 2 week(s).   Specialty: Obstetrics and Gynecology Contact information: [address] [place] [phone]               Please schedule this patient for a In person postpartum visit in 6 weeks with the following provider: Any provider. Additional Postpartum F/U:Incision check 1 week  High risk pregnancy complicated by: repeat LTCS Delivery mode:  C-Section, Low Transverse   Anticipated Birth Control:  Vasectomy     02/29/2020 Hansel Feinstein, CNM

## 2020-02-25 NOTE — Transfer of Care (Signed)
Immediate Anesthesia Transfer of Care Note  Patient: Brianna Chapman  Procedure(s) Performed: CESAREAN SECTION (N/A Abdomen)  Patient Location: PACU  Anesthesia Type:Spinal  Level of Consciousness: awake  Airway & Oxygen Therapy: Patient Spontanous Breathing  Post-op Assessment: Report given to RN and Post -op Vital signs reviewed and stable  Post vital signs: Reviewed and stable  Last Vitals:  Vitals Value Taken Time  BP 107/42 02/25/20 1051  Temp    Pulse 63 02/25/20 1053  Resp 18 02/25/20 1054  SpO2 100 % 02/25/20 1053  Vitals shown include unvalidated device data.  Last Pain:  Vitals:   02/25/20 0804  TempSrc: Oral         Complications: No complications documented.

## 2020-02-25 NOTE — Anesthesia Procedure Notes (Signed)
Spinal  Staffing Performed: anesthesiologist  Anesthesiologist: Bethena Midget, MD Preanesthetic Checklist Completed: patient identified, IV checked, site marked, risks and benefits discussed, surgical consent, monitors and equipment checked, pre-op evaluation and timeout performed Spinal Block Patient position: sitting Prep: ChloraPrep Patient monitoring: continuous pulse ox and blood pressure Approach: midline Location: L3-4 Injection technique: single-shot Needle Needle type: Pencan  Needle gauge: 24 G. Assessment Sensory level: T4

## 2020-02-25 NOTE — Telephone Encounter (Signed)
Left message for patient concerning incision check on 03/03/20 @ 1:15

## 2020-02-25 NOTE — Anesthesia Preprocedure Evaluation (Signed)
Anesthesia Evaluation  Patient identified by MRN, date of birth, ID band Patient awake    Reviewed: Allergy & Precautions, H&P , NPO status , Patient's Chart, lab work & pertinent test results, reviewed documented beta blocker date and time   Airway Mallampati: I  TM Distance: >3 FB Neck ROM: full    Dental no notable dental hx. (+) Teeth Intact, Dental Advisory Given   Pulmonary neg pulmonary ROS,    Pulmonary exam normal breath sounds clear to auscultation       Cardiovascular negative cardio ROS Normal cardiovascular exam Rhythm:regular Rate:Normal     Neuro/Psych negative neurological ROS  negative psych ROS   GI/Hepatic negative GI ROS, Neg liver ROS,   Endo/Other  negative endocrine ROS  Renal/GU negative Renal ROS  negative genitourinary   Musculoskeletal negative musculoskeletal ROS (+)   Abdominal   Peds negative pediatric ROS (+)  Hematology  (+) Blood dyscrasia, anemia ,   Anesthesia Other Findings   Reproductive/Obstetrics (+) Pregnancy                             Anesthesia Physical Anesthesia Plan  ASA: II  Anesthesia Plan: Spinal   Post-op Pain Management:    Induction:   PONV Risk Score and Plan: 2  Airway Management Planned: Natural Airway  Additional Equipment: None  Intra-op Plan:   Post-operative Plan:   Informed Consent: I have reviewed the patients History and Physical, chart, labs and discussed the procedure including the risks, benefits and alternatives for the proposed anesthesia with the patient or authorized representative who has indicated his/her understanding and acceptance.       Plan Discussed with: Anesthesiologist and CRNA  Anesthesia Plan Comments:         Anesthesia Quick Evaluation

## 2020-02-25 NOTE — Op Note (Signed)
Operative Note   SURGERY DATE: 02/25/2020  PRE-OP DIAGNOSIS:  *Pregnancy at [redacted]w[redacted]d * Repeat LTCS   POST-OP DIAGNOSIS: Same   PROCEDURE: repeat low transverse cesarean section via pfannenstiel skin incision with double layer uterine closure  SURGEON: Surgeon(s) and Role:    * Wouk, Wilfred Curtis, MD - Primary    * Nikoloz Huy, Arlana Pouch, MD - Assisting   ANESTHESIA: spinal  ESTIMATED BLOOD LOSS: 300 mL  DRAINS: UOP via indwelling foley  TOTAL IV FLUIDS: crystalloid  VTE PROPHYLAXIS: SCDs to bilateral lower extremities  ANTIBIOTICS: Two grams of Cefazolin were given., within 1 hour of skin incision  SPECIMENS: placenta to L&D  COMPLICATIONS: none  INDICATIONS: repeat LTCS  FINDINGS: Mild intra-abdominal adhesions were noted. Grossly normal uterus, tubes and ovaries. clear amniotic fluid, cephalic female infant, weight pending, APGARs 8/9, intact placenta.  PROCEDURE IN DETAIL: The patient was taken to the operating room where anesthesia was administered and normal fetal heart tones were confirmed. She was then prepped and draped in the normal fashion in the dorsal supine position with a leftward tilt.  After a time out was performed, a pfannensteil skin incision was made with the scalpel and carried through to the underlying layer of fascia. The fascia was then incised at the midline and this incision was extended laterally with the mayo scissors. Attention was turned to the superior aspect of the fascial incision which was grasped with the kocher clamps x 2, tented up and the rectus muscles were dissected off with the mayo scissors. In a similar fashion the inferior aspect of the fascial incision was grasped with the kocher clamps, tented up and the rectus muscles dissected off with the mayo scissors. The rectus muscles were then separated in the midline and the peritoneum was entered bluntly. The vesicouterine peritoneum was identified, tented up and entered with the  metzenbaum scissors. This incision was extended laterally and the bladder flap was created digitally.    A low transverse hysterotomy was made with the scalpel until the endometrial cavity was breached and the amniotic sac ruptured with the Allis clamp, yielding clear amniotic fluid. This incision was extended bluntly and the infant's head, shoulders and body were delivered atraumatically.The cord was clamped x 2 and cut, and the infant was handed to the awaiting pediatricians, after delayed cord clamping was done.  The placenta was then gradually expressed from the uterus and then the uterus was exteriorized and cleared of all clots and debris. The hysterotomy was repaired with a running suture of 1-0 vicryl. A second imbricating layer of 1-0 vicryl suture was then placed. Excellent hemostasis was noted.    The uterus and adnexa were then returned to the abdomen, and the hysterotomy and all operative sites were reinspected and excellent hemostasis was noted after irrigation and suction of the abdomen with warm saline.  The peritoneum was closed with a running stitch of 3-0 Vicryl. The fascia was reapproximated with 0 Vicryl in a simple running fashion bilaterally. The subcutaneous layer was then reapproximated with running sutures of 2-0 plain gut, and the skin was then closed with 4-0 vicryl on a Keith needle.  The patient  tolerated the procedure well. Sponge, lap, needle, and instrument counts were correct x 2. The patient was transferred to the recovery room awake, alert and breathing independently in stable condition.   Casper Harrison, MD [facility] Family Medicine Fellow, [facility] for [facility], [facility]

## 2020-02-25 NOTE — Anesthesia Postprocedure Evaluation (Signed)
Anesthesia Post Note  Patient: Brianna Chapman  Procedure(s) Performed: CESAREAN SECTION (N/A Abdomen)     Patient location during evaluation: Mother Baby Anesthesia Type: Spinal Level of consciousness: oriented and awake and alert Pain management: pain level controlled Vital Signs Assessment: post-procedure vital signs reviewed and stable Respiratory status: spontaneous breathing and respiratory function stable Cardiovascular status: blood pressure returned to baseline and stable Postop Assessment: no headache, no backache, no apparent nausea or vomiting and able to ambulate Anesthetic complications: no   No complications documented.  Last Vitals:  Vitals:   02/25/20 1257 02/25/20 1407  BP: 112/79 102/68  Pulse: (!) 52 (!) 54  Resp: 18 18  Temp: (!) 36 C 36.4 C  SpO2: 100% 100%    Last Pain:  Vitals:   02/25/20 1407  TempSrc: Oral  PainSc:    Pain Goal:                   [name]

## 2020-02-25 NOTE — Anesthesia Postprocedure Evaluation (Signed)
Anesthesia Post Note  Patient: Brianna Chapman  Procedure(s) Performed: CESAREAN SECTION (N/A Abdomen)     Patient location during evaluation: PACU Anesthesia Type: Spinal Level of consciousness: oriented and awake and alert Pain management: pain level controlled Vital Signs Assessment: post-procedure vital signs reviewed and stable Respiratory status: spontaneous breathing, respiratory function stable and patient connected to nasal cannula oxygen Cardiovascular status: blood pressure returned to baseline and stable Postop Assessment: no headache, no backache and no apparent nausea or vomiting Anesthetic complications: no   No complications documented.  Last Vitals:  Vitals:   02/25/20 1145 02/25/20 1205  BP: (!) 89/64 95/62  Pulse: (!) 54 (!) 51  Resp: 11 18  Temp: 36.6 C   SpO2: 100% 100%    Last Pain:  Vitals:   02/25/20 1205  TempSrc:   PainSc: 0-No pain   Pain Goal:    LLE Motor Response: Purposeful movement (02/25/20 1145)   RLE Motor Response: Purposeful movement (02/25/20 1145)       Epidural/Spinal Function Cutaneous sensation: Tingles (02/25/20 1145), Patient able to flex knees: No (02/25/20 1145), Patient able to lift hips off bed: No (02/25/20 1145), Back pain beyond tenderness at insertion site: No (02/25/20 1145), Progressively worsening motor and/or sensory loss: No (02/25/20 1145), Bowel and/or bladder incontinence post epidural: No (02/25/20 1145)  [name]

## 2020-02-25 NOTE — Addendum Note (Signed)
Addendum  created 02/25/20 1440 by Renford Dills, CRNA   Clinical Note Signed

## 2020-02-25 NOTE — H&P (Signed)
LABOR AND DELIVERY ADMISSION HISTORY AND PHYSICAL NOTE  Brianna Chapman is a 44 y.o. female G3P1011 with IUP at [redacted]w[redacted]d by L/12 presenting for scheduled c/s.   She reports positive fetal movement. She denies leakage of fluid or vaginal bleeding.  Prenatal History/Complications:  Past Medical History: Past Medical History:  Diagnosis Date  . Medical history non-contributory     Past Surgical History: Past Surgical History:  Procedure Laterality Date  . CESAREAN SECTION      Obstetrical History: OB History    Gravida  3   Para  1   Term  1   Preterm      AB  1   Living  1     SAB  1   TAB      Ectopic      Multiple      Live Births  1           Social History: Social History   Socioeconomic History  . Marital status: Married    Spouse name: Not on file  . Number of children: Not on file  . Years of education: Not on file  . Highest education level: Not on file  Occupational History  . Not on file  Tobacco Use  . Smoking status: Never Smoker  . Smokeless tobacco: Never Used  Vaping Use  . Vaping Use: Never used  Substance and Sexual Activity  . Alcohol use: Not Currently  . Drug use: Not Currently  . Sexual activity: Yes    Birth control/protection: None  Other Topics Concern  . Not on file  Social History Narrative  . Not on file   Social Determinants of Health   Financial Resource Strain:   . Difficulty of Paying Living Expenses: Not on file  Food Insecurity:   . Worried About Programme researcher, broadcasting/film/video in the Last Year: Not on file  . Ran Out of Food in the Last Year: Not on file  Transportation Needs:   . Lack of Transportation (Medical): Not on file  . Lack of Transportation (Non-Medical): Not on file  Physical Activity:   . Days of Exercise per Week: Not on file  . Minutes of Exercise per Session: Not on file  Stress:   . Feeling of Stress : Not on file  Social Connections:   . Frequency of Communication with Friends and Family:  Not on file  . Frequency of Social Gatherings with Friends and Family: Not on file  . Attends Religious Services: Not on file  . Active Member of Clubs or Organizations: Not on file  . Attends Banker Meetings: Not on file  . Marital Status: Not on file    Family History: No family history on file.  Allergies: No Known Allergies  No medications prior to admission.     Review of Systems   All systems reviewed and negative except as stated in HPI  Blood pressure 105/70, pulse (!) 54, temperature 98.4 F (36.9 C), temperature source Oral, resp. rate 18, height 5\' 4"  (1.626 m), last menstrual period 05/26/2019, SpO2 98 %. General appearance: alert, cooperative and appears stated age Lungs: clear to auscultation bilaterally Heart: regular rate and rhythm Abdomen: soft, non-tender; bowel sounds normal Extremities: No calf swelling or tenderness Presentation: cephalic Fetal monitoring: 152     Prenatal labs: ABO, Rh: --/--/A POS (10/09 07-28-1997) Antibody: NEG (10/09 0943) Rubella: Immune (04/14 0000) RPR: NON REACTIVE (10/09 0943)  HBsAg: Negative (04/14 0000)  HIV: Non Reactive (  09/23 0900)  GBS: Negative/-- (09/21 1648)  2 hr Glucola: wnl Genetic screening:  Nips neg Anatomy US: wnl per report  Prenatal Transfer Tool  Maternal Diabetes: No Genetic Screening: Normal Maternal Ultrasounds/Referrals: Normal Fetal Ultrasounds or other Referrals:  None Maternal Substance Abuse:  No Significant Maternal Medications:  None Significant Maternal Lab Results: Group B Strep negative  Results for orders placed or performed during the hospital encounter of 02/25/20 (from the past 24 hour(s))  Prepare RBC (crossmatch)   Collection Time: 02/25/20  9:30 AM  Result Value Ref Range   Order Confirmation      ORDER PROCESSED BY BLOOD BANK Performed at [facility], [address] [place], [place] [address]     Patient Active Problem List   Diagnosis Date Noted   . Supervision of high risk pregnancy, antepartum 02/05/2020  . Previous cesarean delivery affecting pregnancy, antepartum 02/05/2020  . AMA (advanced maternal age) multigravida 35+ 02/05/2020  . Sickle cell trait (HCC) 02/05/2020    Assessment: Brianna Chapman is a 44 y.o. G3P1011 at [redacted]w[redacted]d here for scheduled repeat cesarean section.  The risks of cesarean section were discussed with the patient including but were not limited to: bleeding which may require transfusion or reoperation; infection which may require antibiotics; injury to bowel, bladder, ureters or other surrounding organs; injury to the fetus; need for additional procedures including hysterectomy in the event of a life-threatening hemorrhage; placental abnormalities wth subsequent pregnancies, incisional problems, thromboembolic phenomenon and other postoperative/anesthesia complications. The patient concurred with the proposed plan, giving informed written consent for the procedures.  Patient has been NPO since midnight she will remain NPO for procedure. Anesthesia and OR aware.  Preoperative prophylactic antibiotics and SCDs ordered on call to the OR.  To OR when ready.  #Anemia: H 8.3 from 9.3 2 weeks prior. Will order 1 unit to be held, plan on prophylactic TXA.  #ID:  gbs neg #MOF: breast #MOC: vasectomy vs condoms, declines BTL #Circ:  Surprise gender but no circ if female  Silvano Bilis 02/25/2020, 9:10 AM

## 2020-02-26 ENCOUNTER — Encounter (HOSPITAL_COMMUNITY): Payer: Self-pay | Admitting: Obstetrics and Gynecology

## 2020-02-26 LAB — CBC
HCT: 22.9 % — ABNORMAL LOW (ref 36.0–46.0)
Hemoglobin: 7.2 g/dL — ABNORMAL LOW (ref 12.0–15.0)
MCH: 23.3 pg — ABNORMAL LOW (ref 26.0–34.0)
MCHC: 31.4 g/dL (ref 30.0–36.0)
MCV: 74.1 fL — ABNORMAL LOW (ref 80.0–100.0)
Platelets: 155 10*3/uL (ref 150–400)
RBC: 3.09 MIL/uL — ABNORMAL LOW (ref 3.87–5.11)
RDW: 17.6 % — ABNORMAL HIGH (ref 11.5–15.5)
WBC: 15.2 10*3/uL — ABNORMAL HIGH (ref 4.0–10.5)
nRBC: 0 % (ref 0.0–0.2)

## 2020-02-26 MED ORDER — SODIUM CHLORIDE 0.9 % IV SOLN
500.0000 mg | Freq: Once | INTRAVENOUS | Status: AC
  Administered 2020-02-26: 500 mg via INTRAVENOUS
  Filled 2020-02-26: qty 25

## 2020-02-26 MED ORDER — SODIUM CHLORIDE 0.9 % IV SOLN
510.0000 mg | Freq: Once | INTRAVENOUS | Status: DC
  Filled 2020-02-26: qty 17

## 2020-02-26 NOTE — Progress Notes (Signed)
POSTPARTUM PROGRESS NOTE  Post Partum Day 1  Subjective:  Brianna Chapman is a 44 y.o. [id number] s/p RLTCS at [redacted]w[redacted]d.  She reports she is doing well. No acute events overnight. She denies any problems with ambulating, voiding or po intake. Denies nausea or vomiting.  Pain is moderately controlled.  Lochia is minimal.  Objective: Blood pressure 102/67, pulse (!) 53, temperature 98.2 F (36.8 C), temperature source Oral, resp. rate 18, height 5\' 4"  (1.626 m), last menstrual period 05/26/2019, SpO2 98 %, unknown if currently breastfeeding.  Physical Exam:  General: alert, cooperative and no distress Chest: no respiratory distress Uterine Fundus: firm, appropriately tender DVT Evaluation: No calf swelling or tenderness Extremities: no edema Skin: warm, dry  Recent Labs    02/23/20 0943 02/26/20 0518  HGB 8.3* 7.2*  HCT 27.2* 22.9*    Assessment/Plan: Brianna Chapman is a 44 y.o. 46 s/p rLTCS at [redacted]w[redacted]d   PPD#1 - Doing well. Continue routine postpartum care.  Anemia - hgb 7.2, feraheme Contraception: vasectomy Feeding: breast Dispo: Plan for discharge tomorrow.   LOS: 1 day   [redacted]w[redacted]d MD, PGY-1 OBGYN Faculty Teaching Service  02/26/2020, 8:04 AM

## 2020-02-27 ENCOUNTER — Other Ambulatory Visit (HOSPITAL_COMMUNITY): Payer: Self-pay | Admitting: Family Medicine

## 2020-02-27 LAB — BPAM RBC
Blood Product Expiration Date: 202110292359
ISSUE DATE / TIME: 202110050812
Unit Type and Rh: 6200

## 2020-02-27 LAB — TYPE AND SCREEN
ABO/RH(D): A POS
Antibody Screen: NEGATIVE
Unit division: 0

## 2020-02-27 MED ORDER — IBUPROFEN 600 MG PO TABS: 600.0000 mg | ORAL_TABLET | Freq: Four times a day (QID) | ORAL | 0 refills | Status: DC | PRN

## 2020-02-27 MED ORDER — OXYCODONE-ACETAMINOPHEN 5-325 MG PO TABS: 1.0000 | ORAL_TABLET | Freq: Four times a day (QID) | ORAL | 0 refills | Status: DC | PRN

## 2020-02-27 MED ORDER — COCONUT OIL OIL: 1.0000 "application " | TOPICAL_OIL | 0 refills | Status: DC | PRN

## 2020-02-27 MED ORDER — PRENATAL MULTIVITAMIN CH: 1.0000 | ORAL_TABLET | Freq: Every day | ORAL | Status: DC

## 2020-02-27 MED ORDER — DOCUSATE SODIUM 100 MG PO CAPS: 100.0000 mg | ORAL_CAPSULE | Freq: Two times a day (BID) | ORAL | 2 refills | Status: DC | PRN

## 2020-02-27 MED ORDER — ACETAMINOPHEN 500 MG PO TABS
1000.0000 mg | ORAL_TABLET | Freq: Four times a day (QID) | ORAL | 0 refills | Status: DC
Start: 2020-02-27 — End: 2020-02-27

## 2020-02-27 MED FILL — OXYCODONE-APAP 5-325MG: 5-325 | 5 days supply | Qty: 20 | Fill #0

## 2020-02-27 MED FILL — IBUPROFEN 600 MG TABLET: 600 | 7 days supply | Qty: 30 | Fill #0

## 2020-02-27 MED FILL — DOCUSATE SODIUM 100 MG CAPS: 100 | 30 days supply | Qty: 60 | Fill #0

## 2020-02-27 NOTE — Discharge Instructions (Signed)
Cesarean Delivery, Care After This sheet gives you information about how to care for yourself after your procedure. Your health care provider may also give you more specific instructions. If you have problems or questions, contact your health care provider. What can I expect after the procedure? After the procedure, it is common to have:  A small amount of blood or clear fluid coming from the incision.  Some redness, swelling, and pain in your incision area.  Some abdominal pain and soreness.  Vaginal bleeding (lochia). Even though you did not have a vaginal delivery, you will still have vaginal bleeding and discharge.  Pelvic cramps.  Fatigue. You may have pain, swelling, and discomfort in the tissue between your vagina and your anus (perineum) if:  Your C-section was unplanned, and you were allowed to labor and push.  An incision was made in the area (episiotomy) or the tissue tore during attempted vaginal delivery. Follow these instructions at home: Incision care   Follow instructions from your health care provider about how to take care of your incision. Make sure you: ? Wash your hands with soap and water before you change your bandage (dressing). If soap and water are not available, use hand sanitizer. ? If you have a dressing, change it or remove it as told by your health care provider. ? Leave stitches (sutures), skin staples, skin glue, or adhesive strips in place. These skin closures may need to stay in place for 2 weeks or longer. If adhesive strip edges start to loosen and curl up, you may trim the loose edges. Do not remove adhesive strips completely unless your health care provider tells you to do that.  Check your incision area every day for signs of infection. Check for: ? More redness, swelling, or pain. ? More fluid or blood. ? Warmth. ? Pus or a bad smell.  Do not take baths, swim, or use a hot tub until your health care provider says it's okay. Ask your health  care provider if you can take showers.  When you cough or sneeze, hug a pillow. This helps with pain and decreases the chance of your incision opening up (dehiscing). Do this until your incision heals. Medicines  Take over-the-counter and prescription medicines only as told by your health care provider.  If you were prescribed an antibiotic medicine, take it as told by your health care provider. Do not stop taking the antibiotic even if you start to feel better.  Do not drive or use heavy machinery while taking prescription pain medicine. Lifestyle  Do not drink alcohol. This is especially important if you are breastfeeding or taking pain medicine.  Do not use any products that contain nicotine or tobacco, such as cigarettes, e-cigarettes, and chewing tobacco. If you need help quitting, ask your health care provider. Eating and drinking  Drink at least 8 eight-ounce glasses of water every day unless told not to by your health care provider. If you breastfeed, you may need to drink even more water.  Eat high-fiber foods every day. These foods may help prevent or relieve constipation. High-fiber foods include: ? Whole grain cereals and breads. ? Brown rice. ? Beans. ? Fresh fruits and vegetables. Activity   If possible, have someone help you care for your baby and help with household activities for at least a few days after you leave the hospital.  Return to your normal activities as told by your health care provider. Ask your health care provider what activities are safe for   you.  Rest as much as possible. Try to rest or take a nap while your baby is sleeping.  Do not lift anything that is heavier than 10 lbs (4.5 kg), or the limit that you were told, until your health care provider says that it is safe.  Talk with your health care provider about when you can engage in sexual activity. This may depend on your: ? Risk of infection. ? How fast you heal. ? Comfort and desire to  engage in sexual activity. General instructions  Do not use tampons or douches until your health care provider approves.  Wear loose, comfortable clothing and a supportive and well-fitting bra.  Keep your perineum clean and dry. Wipe from front to back when you use the toilet.  If you pass a blood clot, save it and call your health care provider to discuss. Do not flush blood clots down the toilet before you get instructions from your health care provider.  Keep all follow-up visits for you and your baby as told by your health care provider. This is important. Contact a health care provider if:  You have: ? A fever. ? Bad-smelling vaginal discharge. ? Pus or a bad smell coming from your incision. ? Difficulty or pain when urinating. ? A sudden increase or decrease in the frequency of your bowel movements. ? More redness, swelling, or pain around your incision. ? More fluid or blood coming from your incision. ? A rash. ? Nausea. ? Little or no interest in activities you used to enjoy. ? Questions about caring for yourself or your baby.  Your incision feels warm to the touch.  Your breasts turn red or become painful or hard.  You feel unusually sad or worried.  You vomit.  You pass a blood clot from your vagina.  You urinate more than usual.  You are dizzy or light-headed. Get help right away if:  You have: ? Pain that does not go away or get better with medicine. ? Chest pain. ? Difficulty breathing. ? Blurred vision or spots in your vision. ? Thoughts about hurting yourself or your baby. ? New pain in your abdomen or in one of your legs. ? A severe headache.  You faint.  You bleed from your vagina so much that you fill more than one sanitary pad in one hour. Bleeding should not be heavier than your heaviest period. Summary  After the procedure, it is common to have pain at your incision site, abdominal cramping, and slight bleeding from your vagina.  Check  your incision area every day for signs of infection.  Tell your health care provider about any unusual symptoms.  Keep all follow-up visits for you and your baby as told by your health care provider. This information is not intended to replace advice given to you by your health care provider. Make sure you discuss any questions you have with your health care provider. Document Revised: 11/09/2017 Document Reviewed: 11/09/2017 Elsevier Patient Education  2020 Elsevier Inc.  

## 2020-02-29 ENCOUNTER — Encounter: Payer: Self-pay | Admitting: Advanced Practice Midwife

## 2020-03-03 ENCOUNTER — Ambulatory Visit

## 2020-03-05 ENCOUNTER — Ambulatory Visit (INDEPENDENT_AMBULATORY_CARE_PROVIDER_SITE_OTHER): Admitting: *Deleted

## 2020-03-05 ENCOUNTER — Other Ambulatory Visit: Payer: Self-pay

## 2020-03-05 ENCOUNTER — Other Ambulatory Visit: Payer: Self-pay | Admitting: *Deleted

## 2020-03-05 VITALS — BP 165/94 | HR 49

## 2020-03-05 DIAGNOSIS — O165 Unspecified maternal hypertension, complicating the puerperium: Secondary | ICD-10-CM

## 2020-03-05 LAB — CBC
Hematocrit: 28.1 % — ABNORMAL LOW (ref 34.0–46.6)
Hemoglobin: 8.2 g/dL — ABNORMAL LOW (ref 11.1–15.9)
MCH: 23.3 pg — ABNORMAL LOW (ref 26.6–33.0)
MCHC: 29.2 g/dL — ABNORMAL LOW (ref 31.5–35.7)
MCV: 80 fL (ref 79–97)
Platelets: 190 10*3/uL (ref 150–450)
RBC: 3.52 x10E6/uL — ABNORMAL LOW (ref 3.77–5.28)
RDW: 21.4 % — ABNORMAL HIGH (ref 11.7–15.4)
WBC: 8 10*3/uL (ref 3.4–10.8)

## 2020-03-05 MED ORDER — BUTALBITAL-APAP-CAFFEINE 50-325-40 MG PO CAPS: 1.0000 | ORAL_CAPSULE | Freq: Four times a day (QID) | ORAL | 3 refills | Status: DC | PRN

## 2020-03-05 MED ORDER — NIFEDIPINE ER OSMOTIC RELEASE 30 MG PO TB24
30.0000 mg | ORAL_TABLET | Freq: Every day | ORAL | 2 refills | Status: DC
Start: 2020-03-05 — End: 2020-04-03

## 2020-03-05 MED ORDER — NIFEDIPINE ER OSMOTIC RELEASE 30 MG PO TB24: 30.0000 mg | ORAL_TABLET | Freq: Every day | ORAL | 2 refills | Status: DC

## 2020-03-05 NOTE — Progress Notes (Signed)
Pt needs these sent to another pharmacy for insurance purposes

## 2020-03-05 NOTE — Progress Notes (Signed)
Patient was assessed and managed by nursing staff during this encounter. I have reviewed the chart and agree with the documentation and plan. I have also made any necessary editorial changes.  Jaynie Collins, MD 03/05/2020 1:50 PM

## 2020-03-05 NOTE — Progress Notes (Signed)
Pt here today for incision check from her c-section on 02/25/20.   Incision healing well, no redness, drainage or swelling noted.   Pt BP in office today was elevated 165/94. Pt states she has had HA's everyday and not any relief after taking  Ibuprofen Pt also states she did have some vision changes for about 4 days and now that's better.   Dr Macon Large  informed of BP and symptoms, labs to be drawn and medications sent to pharmacy. Pt to return in 2 days  To recheck BP, pt also advised to go to [facility] if HA isn't better after taking meds or if she feels worse. Pt verbalizes and understands.

## 2020-03-06 LAB — COMPREHENSIVE METABOLIC PANEL
ALT: 32 IU/L (ref 0–32)
AST: 23 IU/L (ref 0–40)
Albumin/Globulin Ratio: 1.5 (ref 1.2–2.2)
Albumin: 3.7 g/dL — ABNORMAL LOW (ref 3.8–4.8)
Alkaline Phosphatase: 86 IU/L (ref 44–121)
BUN/Creatinine Ratio: 17 (ref 9–23)
BUN: 13 mg/dL (ref 6–24)
Bilirubin Total: 0.4 mg/dL (ref 0.0–1.2)
CO2: 22 mmol/L (ref 20–29)
Calcium: 8.9 mg/dL (ref 8.7–10.2)
Chloride: 104 mmol/L (ref 96–106)
Creatinine, Ser: 0.78 mg/dL (ref 0.57–1.00)
GFR calc Af Amer: 109 mL/min/{1.73_m2} (ref >59)
GFR calc non Af Amer: 95 mL/min/{1.73_m2} (ref >59)
Globulin, Total: 2.5 g/dL (ref 1.5–4.5)
Glucose: 71 mg/dL (ref 65–99)
Potassium: 4.4 mmol/L (ref 3.5–5.2)
Sodium: 139 mmol/L (ref 134–144)
Total Protein: 6.2 g/dL (ref 6.0–8.5)

## 2020-03-07 ENCOUNTER — Other Ambulatory Visit: Payer: Self-pay

## 2020-03-07 ENCOUNTER — Ambulatory Visit (INDEPENDENT_AMBULATORY_CARE_PROVIDER_SITE_OTHER)

## 2020-03-07 DIAGNOSIS — O0990 Supervision of high risk pregnancy, unspecified, unspecified trimester: Secondary | ICD-10-CM

## 2020-03-07 NOTE — Progress Notes (Signed)
Subjective:  Brianna Chapman is a 44 y.o. female here for BP check.   Hypertension ROS: no medication side effects noted, no TIA's, no chest pain on exertion, no dyspnea on exertion and no swelling of ankles.    Objective:  BP 111/70    Pulse 69   Appearance alert, well appearing, and in no distress. General exam BP noted to be well controlled today in office.    Assessment:   Blood Pressure well controlled.   Plan:  Current treatment plan is effective, no change in therapy.Marland Kitchen

## 2020-03-07 NOTE — Progress Notes (Signed)
Patient was assessed and managed by nursing staff during this encounter. I have reviewed the chart and agree with the documentation and plan. I have also made any necessary editorial changes.  Jaynie Collins, MD 03/07/2020 11:31 AM

## 2020-04-03 ENCOUNTER — Encounter: Payer: Self-pay | Admitting: Obstetrics & Gynecology

## 2020-04-03 ENCOUNTER — Other Ambulatory Visit: Payer: Self-pay

## 2020-04-03 ENCOUNTER — Ambulatory Visit (INDEPENDENT_AMBULATORY_CARE_PROVIDER_SITE_OTHER): Admitting: Obstetrics & Gynecology

## 2020-04-03 DIAGNOSIS — O165 Unspecified maternal hypertension, complicating the puerperium: Secondary | ICD-10-CM

## 2020-04-03 MED ORDER — PRENATAL MULTIVITAMIN CH
1.0000 | ORAL_TABLET | Freq: Every day | ORAL | 5 refills | Status: DC
Start: 2020-04-03 — End: 2021-08-18

## 2020-04-03 NOTE — Progress Notes (Signed)
Post Partum Visit Note  Brianna Chapman is a 44 y.o. G53P2012 female who presents for a postpartum visit. She is 5 weeks postpartum following a repeat cesarean section.  I have fully reviewed the prenatal and intrapartum course. The delivery was at 39.2 gestational weeks.  Anesthesia: spinal. Postpartum course has been complicated by hypertension managed on medication, but normal now and patient is no longer needing medication. Baby is doing well. Baby is feeding by breast. Bleeding staining only. Bowel function is normal. Bladder function is normal. Patient is not sexually active. Contraception method is condoms, until husband gets vasectomy. Postpartum depression screening: negative.  The pregnancy intention screening data noted above was reviewed. Potential methods of contraception were discussed. The patient elected to proceed with Female Condom and Vasectomy.   Edinburgh Postnatal Depression Scale - 04/03/20 0945      Edinburgh Postnatal Depression Scale:  In the Past 7 Days   I have been able to laugh and see the funny side of things. 0    I have looked forward with enjoyment to things. 0    I have blamed myself unnecessarily when things went wrong. 1    I have been anxious or worried for no good reason. 0    I have felt scared or panicky for no good reason. 0    Things have been getting on top of me. 2    I have been so unhappy that I have had difficulty sleeping. 0    I have felt sad or miserable. 0    I have been so unhappy that I have been crying. 0    The thought of harming myself has occurred to me. 0    Edinburgh Postnatal Depression Scale Total 3           The following portions of the patient's history were reviewed and updated as appropriate: allergies, current medications, past family history, past medical history, past social history, past surgical history and problem list.  Review of Systems A comprehensive review of systems was negative.    Objective:  BP 117/79    Pulse 65   Wt 146 lb (66.2 kg)   BMI 25.06 kg/m    General:  alert and no distress   Breasts:  inspection negative, no nipple discharge or bleeding, no masses or nodularity palpable  Lungs: clear to auscultation bilaterally  Heart:  regular rate and rhythm  Abdomen: soft, non-tender; bowel sounds normal; no masses,  no organomegaly. Well-healed incision, C/D/I, no erythema, no drainage   Pelvic:  not evaluated        Assessment:   Normal postpartum exam.  Plan:   Essential components of care per ACOG recommendations:  1.  Mood and well being: Patient with negative depression screening today. Reviewed local resources for support.  - Patient does not use tobacco or drugs  2. Infant care and feeding:  -Patient currently breastmilk feeding? Yes. Reviewed importance of draining breast regularly to support lactation. -Social determinants of health (SDOH) reviewed in EPIC. No concerns.  3. Sexuality, contraception and birth spacing - Patient does not want a pregnancy in the next year.  Desired family size is 2 children.  - Reviewed forms of contraception in tiered fashion. Patient desired condoms, vasectomy today.    4. Sleep and fatigue -Encouraged family/partner/community support of 4 hrs of uninterrupted sleep to help with mood and fatigue  5. Physical Recovery  - Discussed patients delivery - Patient has urinary incontinence? No - Patient is  safe to resume physical and sexual activity  6.  Health Maintenance - Last pap smear done 08/21/2019 in [place] and was normal with negative HPV. Mammogram to be done after patient is completed with breastfeeding.  7. Postpartum hypertension - PCP follow up recommended  Jaynie Collins, MD Center for [facility], [facility]

## 2020-10-31 ENCOUNTER — Ambulatory Visit: Admitting: Physician Assistant

## 2021-08-18 ENCOUNTER — Ambulatory Visit (INDEPENDENT_AMBULATORY_CARE_PROVIDER_SITE_OTHER)

## 2021-08-18 VITALS — BP 118/78 | HR 75 | Ht 64.0 in | Wt 146.3 lb

## 2021-08-18 DIAGNOSIS — O3680X0 Pregnancy with inconclusive fetal viability, not applicable or unspecified: Secondary | ICD-10-CM | POA: Diagnosis not present

## 2021-08-18 DIAGNOSIS — O0990 Supervision of high risk pregnancy, unspecified, unspecified trimester: Secondary | ICD-10-CM | POA: Insufficient documentation

## 2021-08-18 DIAGNOSIS — O09521 Supervision of elderly multigravida, first trimester: Secondary | ICD-10-CM | POA: Diagnosis not present

## 2021-08-18 DIAGNOSIS — O09529 Supervision of elderly multigravida, unspecified trimester: Secondary | ICD-10-CM | POA: Insufficient documentation

## 2021-08-18 NOTE — Progress Notes (Cosign Needed)
New OB Intake ? ?I connected with  Brianna Chapman on 08/18/21 at 10:15 AM EDT by in person and verified that I am speaking with the correct person using two identifiers. Nurse is located at [facility] and pt is located at [place]. ? ?I discussed the limitations, risks, security and privacy concerns of performing an evaluation and management service by telephone and the availability of in person appointments. I also discussed with the patient that there may be a patient responsible charge related to this service. The patient expressed understanding and agreed to proceed. ? ?I explained I am completing New OB Intake today. We discussed her EDD of 03/02/22 that is based on LMP of 05/26/21. Pt is G4/P2012. I reviewed her allergies, medications, Medical/Surgical/OB history, and appropriate screenings. I informed her of [facility] services. Based on history, this is a/an  pregnancy uncomplicated.  ? ?Patient Active Problem List  ? Diagnosis Date Noted  ? Sickle cell trait (Early) 02/05/2020  ? ? ?Concerns addressed today ? ?Delivery Plans:  ?Plans to deliver at [facility].  ? ?MyChart/Babyscripts ?MyChart access verified. I explained pt will have some visits in office and some virtually. Babyscripts instructions given and order placed. Patient verifies receipt of registration text/e-mail. Account successfully created and app downloaded. ? ?Blood Pressure Cuff  ?Patient has private insurance; instructed to purchase blood pressure cuff and bring to first prenatal appt. Explained after first prenatal appt pt will check weekly and document in 75. ? ?Weight scale: Patient does / does not  have weight scale. Weight scale ordered for patient to pick up from [employer].  ? ?Anatomy US ?Explained first scheduled [place] will be around 19 weeks. Dating and viability scan performed today.  ? ?Labs ?Discussed Johnsie Cancel genetic screening with patient. Would like both Panorama and Horizon drawn at new OB visit.Also if interested in genetic  testing, tell patient she will need AFP 15-21 weeks to complete genetic testing .Routine prenatal labs needed. ? ?Covid Vaccine ?Patient has covid vaccine.  ?  ?Informed patient of [facility] website  and placed link in her AVS.  ? ?Social Determinants of Health ?Food Insecurity: Patient denies food insecurity. ?WIC Referral: Patient is interested in referral to [facility].  ?Transportation: Patient denies transportation needs. ?Childcare: Discussed no children allowed at ultrasound appointments. Offered childcare services; patient declines childcare services at this time. ? ?Send link to Pregnancy Navigators ? ? ?Placed OB Box on problem list and updated ? ?First visit review ?I reviewed new OB appt with pt. I explained she will have a pelvic exam, ob bloodwork with genetic screening, and PAP smear. Explained pt will be seen by Peggy Constant at first visit; encounter routed to appropriate provider. Explained that patient will be seen by pregnancy navigator following visit with provider. [facility] information placed in AVS.  ? ?Lucianne Lei, RN ?08/18/2021  10:14 AM  ?

## 2021-08-24 NOTE — Progress Notes (Signed)
Patient was assessed and managed by nursing staff during this encounter. I have reviewed the chart and agree with the documentation and plan. I have also made any necessary editorial changes. ? ?Taliyah Watrous, MD ?08/24/2021 3:24 PM  ? ?

## 2021-09-08 ENCOUNTER — Encounter: Payer: Self-pay | Admitting: Obstetrics and Gynecology

## 2021-09-08 ENCOUNTER — Other Ambulatory Visit (HOSPITAL_COMMUNITY)
Admission: RE | Admit: 2021-09-08 | Discharge: 2021-09-08 | Disposition: A | Discharge status: Home / Self Care | Source: Ambulatory Visit | Attending: Obstetrics and Gynecology | Admitting: Obstetrics and Gynecology

## 2021-09-08 ENCOUNTER — Ambulatory Visit (INDEPENDENT_AMBULATORY_CARE_PROVIDER_SITE_OTHER): Admitting: Obstetrics and Gynecology

## 2021-09-08 VITALS — BP 102/66 | HR 60 | Wt 149.0 lb

## 2021-09-08 DIAGNOSIS — Z1151 Encounter for screening for human papillomavirus (HPV): Secondary | ICD-10-CM

## 2021-09-08 DIAGNOSIS — O09529 Supervision of elderly multigravida, unspecified trimester: Secondary | ICD-10-CM | POA: Insufficient documentation

## 2021-09-08 DIAGNOSIS — O34219 Maternal care for unspecified type scar from previous cesarean delivery: Secondary | ICD-10-CM

## 2021-09-08 DIAGNOSIS — O09521 Supervision of elderly multigravida, first trimester: Secondary | ICD-10-CM

## 2021-09-08 MED ORDER — ASPIRIN EC 81 MG PO TBEC: 81.0000 mg | DELAYED_RELEASE_TABLET | Freq: Every day | ORAL | 2 refills | Status: DC

## 2021-09-08 NOTE — Patient Instructions (Signed)

## 2021-09-08 NOTE — Progress Notes (Signed)
?  Subjective:  ? ? Brianna Chapman is a [id number] [redacted]w[redacted]d being seen today for her first obstetrical visit.  Her obstetrical history is significant for advanced maternal age and previous cesarean section x 2 . Patient does intend to breast feed. Pregnancy history fully reviewed. ? ?Patient reports no complaints. ? ?Vitals:  ? 09/08/21 1505  ?BP: 102/66  ?Pulse: 60  ?Weight: 149 lb (67.6 kg)  ? ? ?HISTORY: ?OB History  ?Gravida Para Term Preterm AB Living  ?4 2 2   1 2   ?SAB IAB Ectopic Multiple Live Births  ?1     0 2  ?  ?# Outcome Date GA Lbr Len/2nd Weight Sex Delivery Anes PTL Lv  ?4 Current           ?3 Term 02/25/20 [redacted]w[redacted]d  6 lb 14.6 oz (3.135 kg) F CS-LTranv Spinal  LIV  ?2 SAB 2016          ?1 Term 12/05/08    F CS-LTranv   LIV  ? ?Past Medical History:  ?Diagnosis Date  ?? Keloid   ? on c-section scar  ?? Medical history non-contributory   ?? Postpartum hypertension   ? ?Past Surgical History:  ?Procedure Laterality Date  ?? CESAREAN SECTION  12/05/2008  ?? CESAREAN SECTION N/A 02/25/2020  ? Procedure: CESAREAN SECTION;  Surgeon: Gwynne Edinger, MD;  Location: [facility];  Service: Obstetrics;  Laterality: N/A;  ? ?History reviewed. No pertinent family history. ? ? ?Exam  ? ? ?Uterus:   16-weeks  ?Pelvic Exam:   ? Perineum: declined  ? Vulva:   ? Vagina:    ? pH:   ? Cervix: declined  ? Adnexa:   ? Bony Pelvis:   ?System: Breast:  normal appearance, no masses or tenderness  ? Skin: normal coloration and turgor, no rashes ?  ? Neurologic: oriented, no focal deficits  ? Extremities: normal strength, tone, and muscle mass  ? HEENT extra ocular movement intact  ? Mouth/Teeth mucous membranes moist, pharynx normal without lesions and dental hygiene good  ? Neck supple and no masses  ? Cardiovascular: regular rate and rhythm  ? Respiratory:  appears well, vitals normal, no respiratory distress, acyanotic, normal RR, chest clear, no wheezing, crepitations, rhonchi, normal symmetric air entry  ? Abdomen: soft,  non-tender; bowel sounds normal; no masses,  no organomegaly  ? Urinary:   ? ? ?  ?Assessment:  ? ? Pregnancy: [id number] ?Patient Active Problem List  ? Diagnosis Date Noted  ?? Supervision of multigravida of advanced maternal age 55/08/2021  ?? Previous cesarean section complicating pregnancy [id number]  ?? AMA (advanced maternal age) multigravida 35+ 02/05/2020  ?? Sickle cell trait ([place]) 02/05/2020  ? ?  ? ?  ?Plan:  ? ?  ?Initial labs drawn. ?Prenatal vitamins. ?Problem list reviewed and updated. ?Genetic Screening discussed : panorama and AFP ordered. ? Ultrasound discussed; fetal survey: ordered. ?Patient declined genetic counseling referral ?Patient states normal pap smear in 2021- will obtain records ?Rx ASA provided ? Follow up in 4 weeks. ?50% of 30 min visit spent on counseling and coordination of care.  ?  ? ?Tiegan Jambor ?09/08/2021 ? ? ?

## 2021-09-09 LAB — CERVICOVAGINAL ANCILLARY ONLY
Chlamydia: NEGATIVE
Comment: NEGATIVE
Comment: NORMAL
Neisseria Gonorrhea: NEGATIVE

## 2021-09-10 LAB — AFP, SERUM, OPEN SPINA BIFIDA
AFP MoM: 0.7
AFP Value: 21.7 ng/mL
Gest. Age on Collection Date: 15 weeks
Maternal Age At EDD: 43.9 yr
OSBR Risk 1 IN: 10000
Test Results:: NEGATIVE
Weight: 146 [lb_av]

## 2021-09-10 LAB — URINE CULTURE, OB REFLEX

## 2021-09-10 LAB — CBC/D/PLT+RPR+RH+ABO+RUBIGG...
Antibody Screen: NEGATIVE
Basophils Absolute: 0 10*3/uL (ref 0.0–0.2)
Basos: 0 %
EOS (ABSOLUTE): 0.2 10*3/uL (ref 0.0–0.4)
Eos: 3 %
HCV Ab: NONREACTIVE
HIV Screen 4th Generation wRfx: NONREACTIVE
Hematocrit: 34.4 % (ref 34.0–46.6)
Hemoglobin: 11.1 g/dL (ref 11.1–15.9)
Hepatitis B Surface Ag: NEGATIVE
Immature Grans (Abs): 0 10*3/uL (ref 0.0–0.1)
Immature Granulocytes: 0 %
Lymphocytes Absolute: 2.1 10*3/uL (ref 0.7–3.1)
Lymphs: 23 %
MCH: 27.8 pg (ref 26.6–33.0)
MCHC: 32.3 g/dL (ref 31.5–35.7)
MCV: 86 fL (ref 79–97)
Monocytes Absolute: 0.6 10*3/uL (ref 0.1–0.9)
Monocytes: 6 %
Neutrophils Absolute: 6 10*3/uL (ref 1.4–7.0)
Neutrophils: 68 %
Platelets: 200 10*3/uL (ref 150–450)
RBC: 4 x10E6/uL (ref 3.77–5.28)
RDW: 15.9 % — ABNORMAL HIGH (ref 11.7–15.4)
RPR Ser Ql: NONREACTIVE
Rh Factor: POSITIVE
Rubella Antibodies, IGG: 22 index (ref >0.99)
WBC: 8.9 10*3/uL (ref 3.4–10.8)

## 2021-09-10 LAB — HEMOGLOBIN A1C
Est. average glucose Bld gHb Est-mCnc: 105 mg/dL
Hgb A1c MFr Bld: 5.3 % (ref 4.8–5.6)

## 2021-09-10 LAB — HCV INTERPRETATION

## 2021-09-10 LAB — CULTURE, OB URINE

## 2021-09-14 ENCOUNTER — Encounter: Payer: Self-pay | Admitting: Obstetrics and Gynecology

## 2021-09-16 ENCOUNTER — Encounter: Payer: Self-pay | Admitting: Obstetrics and Gynecology

## 2021-09-16 NOTE — Progress Notes (Signed)
[facility] results scanned to chart.  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  ?Pt had no further questions and declines any further counseling.  ? ? ? ? ?

## 2021-10-06 ENCOUNTER — Other Ambulatory Visit: Payer: Self-pay | Admitting: *Deleted

## 2021-10-06 ENCOUNTER — Ambulatory Visit: Attending: Obstetrics and Gynecology

## 2021-10-06 ENCOUNTER — Encounter: Payer: Self-pay | Admitting: *Deleted

## 2021-10-06 ENCOUNTER — Ambulatory Visit (INDEPENDENT_AMBULATORY_CARE_PROVIDER_SITE_OTHER): Admitting: Obstetrics & Gynecology

## 2021-10-06 ENCOUNTER — Ambulatory Visit: Admitting: *Deleted

## 2021-10-06 ENCOUNTER — Encounter: Payer: Self-pay | Admitting: Obstetrics & Gynecology

## 2021-10-06 VITALS — BP 97/55 | HR 63

## 2021-10-06 VITALS — BP 106/67 | HR 64 | Wt 154.4 lb

## 2021-10-06 DIAGNOSIS — O0992 Supervision of high risk pregnancy, unspecified, second trimester: Secondary | ICD-10-CM

## 2021-10-06 DIAGNOSIS — O34219 Maternal care for unspecified type scar from previous cesarean delivery: Secondary | ICD-10-CM

## 2021-10-06 DIAGNOSIS — O09529 Supervision of elderly multigravida, unspecified trimester: Secondary | ICD-10-CM | POA: Insufficient documentation

## 2021-10-06 DIAGNOSIS — O09522 Supervision of elderly multigravida, second trimester: Secondary | ICD-10-CM

## 2021-10-06 DIAGNOSIS — Z3A19 19 weeks gestation of pregnancy: Secondary | ICD-10-CM

## 2021-10-06 DIAGNOSIS — Z362 Encounter for other antenatal screening follow-up: Secondary | ICD-10-CM

## 2021-10-06 NOTE — Progress Notes (Signed)
PRENATAL VISIT NOTE  Subjective:  Brianna Chapman is a 46 y.o. [id number] at [redacted]w[redacted]d being seen today for ongoing prenatal care.  She is currently monitored for the following issues for this high-risk pregnancy and has AMA (advanced maternal age) multigravida 22+; Sickle cell trait ([place]); Previous cesarean section complicating pregnancy; and Supervision of high-risk pregnancy on their problem list.  Patient reports no complaints.  Contractions: Not present. Vag. Bleeding: None.  Movement: Present. Denies leaking of fluid.   The following portions of the patient's history were reviewed and updated as appropriate: allergies, current medications, past family history, past medical history, past social history, past surgical history and problem list.   Objective:   Vitals:   10/06/21 1539  BP: 106/67  Pulse: 64  Weight: 154 lb 6.4 oz (70 kg)    Fetal Status:     Movement: Present     General:  Alert, oriented and cooperative. Patient is in no acute distress.  Skin: Skin is warm and dry. No rash noted.   Cardiovascular: Normal heart rate noted  Respiratory: Normal respiratory effort, no problems with respiration noted  Abdomen: Soft, gravid, appropriate for gestational age.  Pain/Pressure: Absent     Pelvic: Cervical exam deferred        Extremities: Normal range of motion.  Edema: None  Mental Status: Normal mood and affect. Normal behavior. Normal judgment and thought content.   [place] MFM OB DETAIL +14 WK  Result Date: 10/06/2021 ----------------------------------------------------------------------  OBSTETRICS REPORT                    (Corrected Final 10/06/2021 10:53 am) ---------------------------------------------------------------------- Patient Info  ID #:       [id number]                          D.O.B.:  Jul 17, 1975 (45 yrs)  Name:       Brianna Chapman                 Visit Date: 10/06/2021 10:25 am ---------------------------------------------------------------------- Performed By   Attending:        Tama High MD        Ref. Address:     [place], [place]  Performed By:     Benson [place]          [facility]                                                             [place] [facility]                                                             [facility]  Referred By:      Woodroe Mode                    MD ---------------------------------------------------------------------- Orders  #  Description                           Code        Ordered By  1  [place] MFM OB DETAIL +14 WK               76811.01    PEGGY CONSTANT ----------------------------------------------------------------------  #  Order #                     Accession #                Episode #  1  [id number]                   [id number]                 [id number] ---------------------------------------------------------------------- Indications  Advanced maternal age multigravida 77+,        O53.522  second trimester (46y.o)  Encounter for antenatal screening for          Z36.3  malformations  Previous cesarean delivery, antepartum (x2)    O34.219  Genetic carrier (sickle cell)                  Z14.8  [redacted] weeks gestation of pregnancy                Z3A.19  LR NIPS/Neg AFP ---------------------------------------------------------------------- Fetal Evaluation  Num Of Fetuses:         1  Fetal Heart Rate(bpm):  162  Cardiac Activity:       Observed  Presentation:           Cephalic  Placenta:               Anterior  P. Cord Insertion:      Previously Visualized  Amniotic Fluid  AFI FV:      Within normal limits                              Largest Pocket(cm)                              3.65  ---------------------------------------------------------------------- Biometry  BPD:  42  mm     G. Age:  18w 5d         26  %    CI:         70.1   %    70 - 86                                                          FL/HC:      17.1   %    16.1 - 18.3  HC:       160   mm     G. Age:  18w 6d         22  %    HC/AC:      1.16        1.09 - 1.39  AC:      138.3  mm     G. Age:  19w 2d         44  %    FL/BPD:     65.2   %  FL:       27.4  mm     G. Age:  18w 3d         14  %    FL/AC:      19.8   %    20 - 24  HUM:        27  mm     G. Age:  18w 4d         32  %  CER:      17.9  mm     G. Age:  17w 6d        4.5  %  NFT:       4.0  mm  LV:        6.4  mm  CM:        3.7  mm  Est. FW:     260  gm      0 lb 9 oz     22  % ---------------------------------------------------------------------- Gestational Age  U/S Today:     18w 6d                                        EDD:   03/03/22  Best:          19w 2d     Det. By:  U/S C R L  (08/18/21)    EDD:   02/28/22 ---------------------------------------------------------------------- Anatomy  Cranium:               Appears normal         Aortic Arch:            Appears normal  Cavum:                 Not well visualized    Ductal Arch:            Appears normal  Ventricles:            Appears normal         Diaphragm:              Appears normal  Choroid Plexus:  Appears normal         Stomach:                Appears normal, left                                                                        sided  Cerebellum:            Appears normal         Abdomen:                Appears normal  Posterior Fossa:       Appears normal         Abdominal Wall:         Appears nml (cord                                                                        insert, abd wall)  Nuchal Fold:           Appears normal         Cord Vessels:           Appears normal (3                                                                        vessel cord)  Face:                   Appears normal         Kidneys:                Appear normal                         (orbits and profile)  Lips:                  Appears normal         Bladder:                Appears normal  Thoracic:              Appears normal         Spine:                  Not well visualized  Heart:                 Not well visualized    Upper Extremities:      Appears normal  RVOT:                  Appears normal         Lower Extremities:      Appears normal  LVOT:  Appears normal  Other:  Fetus appears to be female. Nasal bone visualized. Lenses          visualized. VC, 3VV and 3VTV visualized. ---------------------------------------------------------------------- Cervix Uterus Adnexa  Cervix  Length:           5.36  cm.  Normal appearance by transabdominal scan.  Adnexa  No abnormality visualized. ---------------------------------------------------------------------- Impression  G4 P2012. Patient is here for fetal anatomy scan .  On cell-free fetal DNA screening, the risks of fetal  aneuploidies are not increased .MSAFP screening showed  low risk for open-neural tube defects .  Obstetric history significant for 2 term cesarean deliveries.  We performed fetal anatomy scan. No makers of  aneuploidies or fetal structural defects are seen. Fetal  biometry is consistent with her previously-established dates.  Amniotic fluid is normal and good fetal activity is seen.  Patient understands the limitations of ultrasound in detecting  fetal anomalies.  Placenta is anterior and there is no evidence of previa or  placenta accreta spectrum ---------------------------------------------------------------------- Recommendations  -An appointment was made for her to return in 4 weeks for  completion of fetal anatomy (including CSP).  -Fetal growth assessment at 32 weeks and 36 weeks.  -Weekly BPP from 36 weeks till delivery. ----------------------------------------------------------------------                      Tama High, MD Electronically Signed Corrected Final Report  10/06/2021 10:53 am ----------------------------------------------------------------------   Assessment and Plan:  Pregnancy: [id number] at [redacted]w[redacted]d 1. Multigravida of advanced maternal age in second trimester 2. Previous cesarean section complicating pregnancy 3. [redacted] weeks gestation of pregnancy 4. Supervision of high risk pregnancy in second trimester No current issues, follow up scan scheduled by MFM. Preterm labor symptoms and general obstetric precautions including but not limited to vaginal bleeding, contractions, leaking of fluid and fetal movement were reviewed in detail with the patient. Please refer to After Visit Summary for other counseling recommendations.   Return in about 4 weeks (around 11/03/2021) for OFFICE OB VISIT (MD or APP).  Future Appointments  Date Time Provider [place]  11/09/2021  9:30 AM [facility] NURSE [facility]  11/09/2021  9:45 AM [facility] US4 [facility] [place]    Verita Schneiders, MD

## 2021-11-09 ENCOUNTER — Ambulatory Visit: Attending: Obstetrics and Gynecology

## 2021-11-09 ENCOUNTER — Encounter: Admitting: Obstetrics and Gynecology

## 2021-11-09 ENCOUNTER — Ambulatory Visit

## 2021-11-13 ENCOUNTER — Telehealth: Payer: Self-pay

## 2021-11-25 ENCOUNTER — Ambulatory Visit (INDEPENDENT_AMBULATORY_CARE_PROVIDER_SITE_OTHER): Admitting: Family Medicine

## 2021-11-25 VITALS — BP 112/71 | HR 80 | Wt 162.6 lb

## 2021-11-25 DIAGNOSIS — O34219 Maternal care for unspecified type scar from previous cesarean delivery: Secondary | ICD-10-CM

## 2021-11-25 DIAGNOSIS — O0992 Supervision of high risk pregnancy, unspecified, second trimester: Secondary | ICD-10-CM

## 2021-11-25 DIAGNOSIS — Z3A26 26 weeks gestation of pregnancy: Secondary | ICD-10-CM

## 2021-11-25 DIAGNOSIS — O09522 Supervision of elderly multigravida, second trimester: Secondary | ICD-10-CM

## 2021-11-25 NOTE — Progress Notes (Signed)
   PRENATAL VISIT NOTE  Subjective:  Brianna Chapman is a 46 y.o. [id number] at [redacted]w[redacted]d being seen today for ongoing prenatal care.  She is currently monitored for the following issues for this high-risk pregnancy and has AMA (advanced maternal age) multigravida 35+; Sickle cell trait (HCC); Previous cesarean section complicating pregnancy; and Supervision of high-risk pregnancy on their problem list.  Patient reports no complaints.  Contractions: Not present. Vag. Bleeding: None.  Movement: Present. Denies leaking of fluid.   The following portions of the patient's history were reviewed and updated as appropriate: allergies, current medications, past family history, past medical history, past social history, past surgical history and problem list.   Objective:   Vitals:   11/25/21 1535  BP: 112/71  Pulse: 80  Weight: 162 lb 9.6 oz (73.8 kg)    Fetal Status: Fetal Heart Rate (bpm): 148   Movement: Present     General:  Alert, oriented and cooperative. Patient is in no acute distress.  Skin: Skin is warm and dry. No rash noted.   Cardiovascular: Normal heart rate noted  Respiratory: Normal respiratory effort, no problems with respiration noted  Abdomen: Soft, gravid, appropriate for gestational age.  Pain/Pressure: Absent     Pelvic: Cervical exam deferred        Extremities: Normal range of motion.  Edema: None  Mental Status: Normal mood and affect. Normal behavior. Normal judgment and thought content.   Assessment and Plan:  Pregnancy: [id number] at [redacted]w[redacted]d 1. Supervision of high risk pregnancy in second trimester FHT and FH normal Patient planning on vasectomy for contraception  2. Previous cesarean section complicating pregnancy  3. Multigravida of advanced maternal age in second trimester Will schedule for growth scan. ASA 81mg   Preterm labor symptoms and general obstetric precautions including but not limited to vaginal bleeding, contractions, leaking of fluid and fetal movement  were reviewed in detail with the patient. Please refer to After Visit Summary for other counseling recommendations.   Return in about 2 weeks (around 12/09/2021) for HR OB f/u, 2 hr GTT.  No future appointments.  12/11/2021, DO

## 2021-11-25 NOTE — Progress Notes (Signed)
Patient presents for ROB. Patient has no concerns today. 

## 2021-12-08 ENCOUNTER — Ambulatory Visit (INDEPENDENT_AMBULATORY_CARE_PROVIDER_SITE_OTHER): Admitting: Family Medicine

## 2021-12-08 ENCOUNTER — Other Ambulatory Visit

## 2021-12-08 VITALS — BP 100/65 | HR 68 | Wt 162.4 lb

## 2021-12-08 DIAGNOSIS — D573 Sickle-cell trait: Secondary | ICD-10-CM

## 2021-12-08 DIAGNOSIS — O09523 Supervision of elderly multigravida, third trimester: Secondary | ICD-10-CM

## 2021-12-08 DIAGNOSIS — O34219 Maternal care for unspecified type scar from previous cesarean delivery: Secondary | ICD-10-CM

## 2021-12-08 DIAGNOSIS — O0992 Supervision of high risk pregnancy, unspecified, second trimester: Secondary | ICD-10-CM

## 2021-12-08 NOTE — Progress Notes (Signed)
ROB 28 wks GTT, CBC, HIV, RPR today TDAP offered and deferred Depression and anxiety screen negative Husband plans vasectomy after delivery  MFM appt 12/10/21

## 2021-12-08 NOTE — Progress Notes (Signed)
   PRENATAL VISIT NOTE  Subjective:  Brianna Chapman is a 46 y.o. [id number] at [redacted]w[redacted]d being seen today for ongoing prenatal care.  She is currently monitored for the following issues for this high-risk pregnancy and has AMA (advanced maternal age) multigravida 55+; Sickle cell trait (HCC); Previous cesarean section complicating pregnancy; and Supervision of high-risk pregnancy on their problem list.  Patient reports no complaints.  Contractions: Not present. Vag. Bleeding: None.  Movement: Present. Denies leaking of fluid.   The following portions of the patient's history were reviewed and updated as appropriate: allergies, current medications, past family history, past medical history, past social history, past surgical history and problem list.   Objective:   Vitals:   12/08/21 0931  BP: 100/65  Pulse: 68  Weight: 162 lb 6.4 oz (73.7 kg)    Fetal Status: Fetal Heart Rate (bpm): 132   Movement: Present     General:  Alert, oriented and cooperative. Patient is in no acute distress.  Skin: Skin is warm and dry. No rash noted.   Cardiovascular: Normal heart rate noted  Respiratory: Normal respiratory effort, no problems with respiration noted  Abdomen: Soft, gravid, appropriate for gestational age.  Pain/Pressure: Absent     Pelvic: Cervical exam deferred        Extremities: Normal range of motion.  Edema: None  Mental Status: Normal mood and affect. Normal behavior. Normal judgment and thought content.   Assessment and Plan:  Pregnancy: [id number] at [redacted]w[redacted]d 1. Supervision of high risk pregnancy in second trimester FHT and FH normal - Glucose Tolerance, 2 Hours w/1 Hour - CBC - RPR - HIV Antibody (routine testing w rflx)  2. Previous cesarean section complicating pregnancy RLTCS schedueld at 39 weeks Does form keloids. Requests steroid injection at time of delivery.  3. Multigravida of advanced maternal age in third trimester [place] on thursday  4. Sickle cell trait (HCC)  Preterm  labor symptoms and general obstetric precautions including but not limited to vaginal bleeding, contractions, leaking of fluid and fetal movement were reviewed in detail with the patient. Please refer to After Visit Summary for other counseling recommendations.   Return in about 2 weeks (around 12/22/2021) for OB f/u.  Future Appointments  Date Time Provider Department Center  12/10/2021  3:15 PM [facility] NURSE [facility]  12/10/2021  3:30 PM [facility] US3 [facility]    Levie Heritage, DO

## 2021-12-09 LAB — CBC
Hematocrit: 34.8 % (ref 34.0–46.6)
Hemoglobin: 11.5 g/dL (ref 11.1–15.9)
MCH: 29.3 pg (ref 26.6–33.0)
MCHC: 33 g/dL (ref 31.5–35.7)
MCV: 89 fL (ref 79–97)
Platelets: 183 10*3/uL (ref 150–450)
RBC: 3.93 x10E6/uL (ref 3.77–5.28)
RDW: 14.6 % (ref 11.7–15.4)
WBC: 9.5 10*3/uL (ref 3.4–10.8)

## 2021-12-09 LAB — GLUCOSE TOLERANCE, 2 HOURS W/ 1HR
Glucose, 1 hour: 187 mg/dL — ABNORMAL HIGH (ref 70–179)
Glucose, 2 hour: 114 mg/dL (ref 70–152)
Glucose, Fasting: 83 mg/dL (ref 70–91)

## 2021-12-09 LAB — RPR: RPR Ser Ql: NONREACTIVE

## 2021-12-09 LAB — HIV ANTIBODY (ROUTINE TESTING W REFLEX): HIV Screen 4th Generation wRfx: NONREACTIVE

## 2021-12-10 ENCOUNTER — Ambulatory Visit: Admitting: *Deleted

## 2021-12-10 ENCOUNTER — Ambulatory Visit: Attending: Obstetrics and Gynecology

## 2021-12-10 ENCOUNTER — Other Ambulatory Visit: Payer: Self-pay | Admitting: *Deleted

## 2021-12-10 VITALS — BP 105/62 | HR 67

## 2021-12-10 DIAGNOSIS — O34219 Maternal care for unspecified type scar from previous cesarean delivery: Secondary | ICD-10-CM

## 2021-12-10 DIAGNOSIS — O09523 Supervision of elderly multigravida, third trimester: Secondary | ICD-10-CM

## 2021-12-10 DIAGNOSIS — Z362 Encounter for other antenatal screening follow-up: Secondary | ICD-10-CM | POA: Diagnosis not present

## 2021-12-10 DIAGNOSIS — O0992 Supervision of high risk pregnancy, unspecified, second trimester: Secondary | ICD-10-CM | POA: Insufficient documentation

## 2021-12-10 DIAGNOSIS — O09522 Supervision of elderly multigravida, second trimester: Secondary | ICD-10-CM | POA: Diagnosis not present

## 2021-12-10 MED ORDER — ACCU-CHEK SOFTCLIX LANCETS MISC: 1.0000 | Freq: Four times a day (QID) | 12 refills | Status: DC

## 2021-12-10 MED ORDER — ACCU-CHEK GUIDE ME W/DEVICE KIT
1.0000 | PACK | 0 refills | Status: DC
Start: 2021-12-10 — End: 2022-02-26

## 2021-12-10 MED ORDER — ACCU-CHEK GUIDE VI STRP
ORAL_STRIP | 12 refills | Status: DC
Start: 2021-12-10 — End: 2022-02-26

## 2021-12-10 NOTE — Addendum Note (Signed)
Addended by: Levie Heritage on: 12/10/2021 04:10 PM   Modules accepted: Orders

## 2021-12-23 ENCOUNTER — Other Ambulatory Visit: Payer: Self-pay | Admitting: Family Medicine

## 2021-12-23 ENCOUNTER — Encounter: Admitting: Obstetrics and Gynecology

## 2021-12-23 DIAGNOSIS — O0993 Supervision of high risk pregnancy, unspecified, third trimester: Secondary | ICD-10-CM

## 2021-12-23 DIAGNOSIS — O34219 Maternal care for unspecified type scar from previous cesarean delivery: Secondary | ICD-10-CM

## 2021-12-23 DIAGNOSIS — L910 Hypertrophic scar: Secondary | ICD-10-CM

## 2022-01-05 ENCOUNTER — Other Ambulatory Visit: Payer: Self-pay

## 2022-01-05 ENCOUNTER — Ambulatory Visit (INDEPENDENT_AMBULATORY_CARE_PROVIDER_SITE_OTHER): Admitting: Registered"

## 2022-01-05 ENCOUNTER — Encounter: Attending: Family Medicine | Admitting: Registered"

## 2022-01-05 DIAGNOSIS — O0992 Supervision of high risk pregnancy, unspecified, second trimester: Secondary | ICD-10-CM | POA: Diagnosis present

## 2022-01-05 DIAGNOSIS — O24419 Gestational diabetes mellitus in pregnancy, unspecified control: Secondary | ICD-10-CM

## 2022-01-05 DIAGNOSIS — Z713 Dietary counseling and surveillance: Secondary | ICD-10-CM | POA: Diagnosis not present

## 2022-01-05 DIAGNOSIS — Z3A00 Weeks of gestation of pregnancy not specified: Secondary | ICD-10-CM | POA: Diagnosis not present

## 2022-01-05 NOTE — Progress Notes (Signed)
Patient was seen for Gestational Diabetes self-management on 01/05/22  Start time 1510 and End time 1605   Estimated due date: 03/03/23; [redacted]w[redacted]d  Clinical: Medications: reviewed Medical History: GDM 13 yrs ago Labs: OGTT 1 hr 187, A1c 5.8%   Dietary and Lifestyle History: Pt states she is very active, was walking in addition to pilates and other work outs but stopped walking (outside) due to heat and affect on skin.  Diet: Pt states last GDM was diet controlled. Pt states she likes to eat out [place] foods and is careful with sauces due to high sugar and salt content. Pt states she is drinking grape juice as a substitute for Wine.  Pt states she is being careful of portion size because doesn't want to gain too much weight in pregnancy. Pt reports prior to pregnancy was doing intermittent fasting.  Pt states for ~4 weeks she has been having nausea, gas, heartburn controlled with Tums. Pt reports ice craving since ~May when got. Pt states after c-section last pregnancy had low iron.  Physical Activity: pilates 2x/week, with bands and ball 3x/week Stress: very calm Sleep: about 8 hrs total: After 5 hrs wakes up to check on 46 yr old, goes back to sleep and also has nap in afternoon  24 hr Recall:  Water, black coffee First Meal: (10 am) [place] or [place] bread, egg, lettuce, tomato Snack: fruit Second meal: 1 c rice and beans, chicken hearts, 30 or 40 min after had grape juice (16 oz?) pt reports PPBG 120 mg/dL Snack: banana Third meal: tuna, lettuce, ~8 oz grape juice 30-40 min after meal Snack: ice cream Beverages: a lot of water, 1 cup coffee, grape juice, ice  NUTRITION INTERVENTION  Nutrition education (E-1) on the following topics:   Initial Follow-up  [x]  []  Definition of Gestational Diabetes - Discussed A1c of 5.8% [x]  []  Why dietary management is important in controlling blood glucose [x]  []  Effects each nutrient has on blood glucose levels []  []  Simple carbohydrates vs  complex carbohydrates []  []  Fluid intake [x]  []  Creating a balanced meal plan [x]  []  Carbohydrate counting  [x]  []  When to check blood glucose levels [x]  []  Proper blood glucose monitoring techniques [x]  []  Effect of stress and stress reduction techniques  [x]  []  Exercise effect on blood glucose levels, appropriate exercise during pregnancy [x]  []  Importance of limiting caffeine and abstaining from alcohol and smoking [x]  []  Medications used for blood sugar control during pregnancy [x]  []  Hypoglycemia and rule of 15 [x]  []  Postpartum self care  Patient has a meter prior to visit. Patient is testing pre breakfast and 2 hours after each meal. Pt states blood sugar has been WNL FBS: 90 Postprandial: 110-120 mg/dL  Patient instructed to monitor glucose levels: FBS: 60 - ? 95 mg/dL; 2 hour: ? mg/dL  Patient received handouts: Nutrition Diabetes and Pregnancy Carbohydrate Counting List A1c chart  Patient will be seen for follow-up as needed.

## 2022-01-06 ENCOUNTER — Encounter: Payer: Self-pay | Admitting: Student

## 2022-01-06 ENCOUNTER — Ambulatory Visit (INDEPENDENT_AMBULATORY_CARE_PROVIDER_SITE_OTHER): Admitting: Student

## 2022-01-06 VITALS — BP 101/62 | Wt 164.6 lb

## 2022-01-06 DIAGNOSIS — O09523 Supervision of elderly multigravida, third trimester: Secondary | ICD-10-CM

## 2022-01-06 DIAGNOSIS — Z3A32 32 weeks gestation of pregnancy: Secondary | ICD-10-CM

## 2022-01-06 DIAGNOSIS — O24419 Gestational diabetes mellitus in pregnancy, unspecified control: Secondary | ICD-10-CM

## 2022-01-06 DIAGNOSIS — D573 Sickle-cell trait: Secondary | ICD-10-CM

## 2022-01-06 DIAGNOSIS — O0993 Supervision of high risk pregnancy, unspecified, third trimester: Secondary | ICD-10-CM

## 2022-01-06 DIAGNOSIS — O34219 Maternal care for unspecified type scar from previous cesarean delivery: Secondary | ICD-10-CM

## 2022-01-06 NOTE — Progress Notes (Signed)
Pt presents for ROB visit with no concerns at this time.  

## 2022-01-06 NOTE — Progress Notes (Signed)
   PRENATAL VISIT NOTE  Subjective:  Brianna Chapman is a 46 y.o. [id number] at [redacted]w[redacted]d being seen today for ongoing prenatal care.  She is currently monitored for the following issues for this high-risk pregnancy and has AMA (advanced maternal age) multigravida 70+; Sickle cell trait ([place]); Previous cesarean section complicating pregnancy; Supervision of high-risk pregnancy; and Gestational diabetes mellitus (GDM) in third trimester on their problem list.  Patient reports no complaints.  Contractions: Irritability. Vag. Bleeding: None.  Movement: Present. Denies leaking of fluid.   The following portions of the patient's history were reviewed and updated as appropriate: allergies, current medications, past family history, past medical history, past social history, past surgical history and problem list.   Objective:   Vitals:   01/06/22 1540  BP: 101/62  Weight: 164 lb 9.6 oz (74.7 kg)    Fetal Status: Fetal Heart Rate (bpm): 138 Fundal Height: 32 cm Movement: Present     General:  Alert, oriented and cooperative. Patient is in no acute distress.  Skin: Skin is warm and dry. No rash noted.   Cardiovascular: Normal heart rate noted  Respiratory: Normal respiratory effort, no problems with respiration noted  Abdomen: Soft, gravid, appropriate for gestational age.  Pain/Pressure: Absent     Pelvic: Cervical exam deferred        Extremities: Normal range of motion.  Edema: None  Mental Status: Normal mood and affect. Normal behavior. Normal judgment and thought content.   Assessment and Plan:  Pregnancy: [id number] at [redacted]w[redacted]d 1. Supervision of high risk pregnancy in third trimester - Doing well, reports frequent fetal movement  2. [redacted] weeks gestation of pregnancy - Routine follow-up  3. Gestational diabetes mellitus (GDM) in third trimester, gestational diabetes method of control unspecified - Met with RD yesterday. Is planning on picking up supplies from pharmacy to monitor blood sugars  either tonight or tomorrow. Verbalized understanding of recording blood sugars and bringing log to follow-up appointments. F/U growth [place] scheduled for tomorrow.  88. Multigravida of advanced maternal age in third trimester - F/U growth [place] scheduled for tomorrow -LR NIPS/AFP Neg/[facility]  Trait  5. Sickle cell trait ([place])   6. Previous cesarean section complicating pregnancy - C/S scheduled for 02/24/22 at 39 weeks - Does form keloids. Requests steroid injection at time of delivery.  Preterm labor symptoms and general obstetric precautions including but not limited to vaginal bleeding, contractions, leaking of fluid and fetal movement were reviewed in detail with the patient. Please refer to After Visit Summary for other counseling recommendations.   Return in about 2 weeks (around 01/20/2022) for [facility].  Future Appointments  Date Time Provider [place]  01/07/2022  3:30 PM [facility] NURSE [facility]  01/07/2022  3:45 PM [facility] US5 [facility]  01/20/2022  3:30 PM Anyanwu, Sallyanne Havers, MD [place] None  02/03/2022  3:50 PM Johnston Ebbs, NP [place] None  02/04/2022  2:30 PM [facility] NURSE [facility]  02/04/2022  2:45 PM [facility] US5 [facility]  02/10/2022  3:50 PM Chancy Milroy, MD [place] None  02/17/2022  3:50 PM Gavin Pound, CNM [place] None  02/24/2022  3:50 PM Chancy Milroy, MD [place] None  03/02/2022  3:50 PM Constant, Vickii Chafe, MD [place] None    Johnston Ebbs, NP

## 2022-01-07 ENCOUNTER — Ambulatory Visit: Attending: Obstetrics and Gynecology

## 2022-01-07 ENCOUNTER — Ambulatory Visit: Admitting: *Deleted

## 2022-01-07 VITALS — BP 100/56 | HR 74

## 2022-01-07 DIAGNOSIS — O0993 Supervision of high risk pregnancy, unspecified, third trimester: Secondary | ICD-10-CM | POA: Insufficient documentation

## 2022-01-07 DIAGNOSIS — O09523 Supervision of elderly multigravida, third trimester: Secondary | ICD-10-CM | POA: Diagnosis present

## 2022-01-07 DIAGNOSIS — O285 Abnormal chromosomal and genetic finding on antenatal screening of mother: Secondary | ICD-10-CM

## 2022-01-07 DIAGNOSIS — Z3A32 32 weeks gestation of pregnancy: Secondary | ICD-10-CM

## 2022-01-07 DIAGNOSIS — D573 Sickle-cell trait: Secondary | ICD-10-CM

## 2022-01-07 DIAGNOSIS — O34219 Maternal care for unspecified type scar from previous cesarean delivery: Secondary | ICD-10-CM | POA: Diagnosis not present

## 2022-01-08 ENCOUNTER — Other Ambulatory Visit: Payer: Self-pay | Admitting: *Deleted

## 2022-01-08 DIAGNOSIS — O09523 Supervision of elderly multigravida, third trimester: Secondary | ICD-10-CM

## 2022-01-08 DIAGNOSIS — O24419 Gestational diabetes mellitus in pregnancy, unspecified control: Secondary | ICD-10-CM

## 2022-01-20 ENCOUNTER — Ambulatory Visit (INDEPENDENT_AMBULATORY_CARE_PROVIDER_SITE_OTHER): Admitting: Obstetrics & Gynecology

## 2022-01-20 VITALS — BP 104/71 | HR 78 | Wt 164.0 lb

## 2022-01-20 DIAGNOSIS — O0993 Supervision of high risk pregnancy, unspecified, third trimester: Secondary | ICD-10-CM

## 2022-01-20 DIAGNOSIS — Z3A34 34 weeks gestation of pregnancy: Secondary | ICD-10-CM

## 2022-01-20 DIAGNOSIS — O24419 Gestational diabetes mellitus in pregnancy, unspecified control: Secondary | ICD-10-CM

## 2022-01-20 DIAGNOSIS — O34219 Maternal care for unspecified type scar from previous cesarean delivery: Secondary | ICD-10-CM

## 2022-01-20 NOTE — Progress Notes (Signed)
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  She is currently monitored for the following issues for this high-risk pregnancy and has AMA (advanced maternal age) multigravida 26+; Sickle cell trait ([place]); Previous cesarean section complicating pregnancy; Supervision of high-risk pregnancy; and Gestational diabetes mellitus (GDM) in third trimester on their problem list.  Patient reports no complaints.  Contractions: Irregular. Vag. Bleeding: None.  Movement: Present. Denies leaking of fluid.   The following portions of the patient's history were reviewed and updated as appropriate: allergies, current medications, past family history, past medical history, past social history, past surgical history and problem list.   Objective:   Vitals:   01/20/22 1518  BP: 104/71  Pulse: 78  Weight: 164 lb (74.4 kg)    Fetal Status: Fetal Heart Rate (bpm): 140   Movement: Present     General:  Alert, oriented and cooperative. Patient is in no acute distress.  Skin: Skin is warm and dry. No rash noted.   Cardiovascular: Normal heart rate noted  Respiratory: Normal respiratory effort, no problems with respiration noted  Abdomen: Soft, gravid, appropriate for gestational age.  Pain/Pressure: Absent     Pelvic: Cervical exam deferred        Extremities: Normal range of motion.     Mental Status: Normal mood and affect. Normal behavior. Normal judgment and thought content.   [place] MFM OB FOLLOW UP  Result Date: 01/07/2022 ----------------------------------------------------------------------  OBSTETRICS REPORT                       (Signed Final 01/07/2022 04:38 pm) ---------------------------------------------------------------------- Patient Info  ID #:       [id number]                          D.O.B.:  10-02-1975 (45 yrs)  Name:       Brianna Chapman                 Visit Date: 01/07/2022 04:17 pm  ---------------------------------------------------------------------- Performed By  Attending:        MFM Provider           Ref. Address:     1st floor                                                             [address]                                                             [place], [place]  Performed By:     Benson Norway  [facility]                                                             [place] [facility]                                                             [facility]  Referred By:      Woodroe Mode                    MD ---------------------------------------------------------------------- Orders  #  Description                           Code        Ordered By  1  [place] MFM OB FOLLOW UP                   [id number]    YU FANG ----------------------------------------------------------------------  #  Order #                     Accession #                Episode #  1  [id number]                   [id number]                 [id number] ---------------------------------------------------------------------- Indications  Advanced maternal age multigravida 33+,        O85.523  third trimester (45)  Previous cesarean delivery, antepartum (x2)    O34.219  Genetic carrier (sickle cell)                  Z14.8  [redacted] weeks gestation of pregnancy                Z3A.32  LR NIPS/Neg AFP ---------------------------------------------------------------------- Vital Signs  BP:          100/56 ---------------------------------------------------------------------- Fetal Evaluation  Num Of Fetuses:         1  Fetal Heart Rate(bpm):  140  Cardiac Activity:       Observed  Presentation:           Cephalic  Placenta:               Anterior  P. Cord Insertion:      Previously Visualized  Amniotic Fluid  AFI FV:      Within normal limits  AFI Sum(cm)     %Tile        Largest Pocket(cm)  12.5            36  5.36  RUQ(cm)       RLQ(cm)       LUQ(cm)        LLQ(cm)  2.59          2.33          2.22           5.36 ---------------------------------------------------------------------- Biometry  BPD:     78.86  mm     G. Age:  31w 5d         18  %    CI:        71.72   %    70 - 86                                                          FL/HC:      20.3   %    19.9 - 21.5  HC:    296.43   mm     G. Age:  32w 5d         19  %    HC/AC:      0.98        0.96 - 1.11  AC:    302.39   mm     G. Age:  34w 1d         90  %    FL/BPD:     76.4   %    71 - 87  FL:      60.28  mm     G. Age:  31w 2d         11  %    FL/AC:      19.9   %    20 - 24  Est. FW:    2100  gm    4 lb 10 oz      54  % ---------------------------------------------------------------------- Gestational Age  U/S Today:     32w 3d                                        EDD:   03/01/22  Best:          32w 4d     Det. By:  U/S C R L  (08/18/21)    EDD:   02/28/22 ---------------------------------------------------------------------- Anatomy  Cranium:               Appears normal         Aortic Arch:            Previously seen  Cavum:                 Appears normal         Ductal Arch:            Previously seen  Ventricles:            Previously seen        Diaphragm:              Appears normal  Choroid Plexus:        Previously seen        Stomach:                Appears  normal, left                                                                        sided  Cerebellum:            Previously seen        Abdomen:                Appears normal  Posterior Fossa:       Previously seen        Abdominal Wall:         Previously seen  Nuchal Fold:           Not well visualized    Cord Vessels:           Previously seen  Face:                  Orbits and profile     Kidneys:                Appear normal                         previously seen  Lips:                  Previously seen        Bladder:                 Appears normal  Thoracic:              Appears normal         Spine:                  Previously seen  Heart:                 Previously seen        Upper Extremities:      Previously seen  RVOT:                  Previously seen        Lower Extremities:      Previously seen  LVOT:                  Previously seen  Other:  Fetus appears to be female. VC, 3VV and 3VTV, Heels/feet and open          hands/5th digits, Nasal bone, lenses, maxilla, mandible and falx          previously visualized ---------------------------------------------------------------------- Cervix Uterus Adnexa  Adnexa  No abnormality visualized. ---------------------------------------------------------------------- Comments  Follow-up growth ultrasound at 32w 4d with EDD of  02/28/2022 dated by U/S C R L  (08/18/21). Pregnancy  complicated by AMA (68), gDMA1. Aneuploidy screening: low  risk NIPS.  Sonographic findings  Single intrauterine pregnancy at at  [place] 4d.  Observed fetal cardiac activity.  Cephalic presentation.  Interval fetal anatomy appears normal.  Fetal biometry shows the estimated fetal weight at the 54  percentile.  Amniotic fluid volume: Within normal limits. AFI: 12.5 cm.  MVP: 5.36 cm.  Placenta is Anterior.  Tone, breathing and movment noted but a formal BPP was  not indicated.  Recommendations  1. Serial growth ultrasounds  every 4 weeks until delivery.  2. Antenatal testing to start around 36 weeks (either weekly  BPP or twice weekly NST with weekly AFI) or if she requires  medications for blood sugar control then a weekly BPP is  recommended until delivery. ----------------------------------------------------------------------                  Braxton Feathers, DO Electronically Signed Final Report   01/07/2022 04:38 pm ----------------------------------------------------------------------   Assessment and Plan:  Pregnancy: [id number] at [redacted]w[redacted]d 1. Gestational diabetes mellitus (GDM) in third trimester CBGs within range.  Continue diet and exercise control for now.  Already scheduled for for follow up MFM scans  2. Previous cesarean section complicating pregnancy Already scheduled for RCS  3. [redacted] weeks gestation of pregnancy 4. Supervision of high risk pregnancy in third trimester No other concerns.  Preterm labor symptoms and general obstetric precautions including but not limited to vaginal bleeding, contractions, leaking of fluid and fetal movement were reviewed in detail with the patient. Please refer to After Visit Summary for other counseling recommendations.   Return in about 2 weeks (around 02/03/2022) for Pelvic cultures, OFFICE OB VISIT (MD or APP).  Future Appointments  Date Time Provider Department Center  02/03/2022  3:50 PM Warden Fillers, MD [facility] None  02/04/2022  2:30 PM [facility] NURSE [facility]  02/04/2022  2:45 PM [facility] US5 [facility]  02/10/2022  3:50 PM Hermina Staggers, MD [facility] None  02/11/2022  3:30 PM [facility] NURSE [facility]  02/11/2022  3:45 PM [facility] US5 [facility]  02/17/2022  4:10 PM Gerrit Heck, CNM [facility] None  02/18/2022  3:15 PM [facility] NURSE [facility]  02/18/2022  3:30 PM [facility] US3 [facility]  02/24/2022  3:30 PM Hermina Staggers, MD [facility] None  03/02/2022  3:50 PM Constant, Gigi Gin, MD [facility] None    Jaynie Collins, MD

## 2022-01-20 NOTE — Patient Instructions (Signed)
Return to office for any scheduled appointments. Call the office or go to the MAU at [facility] at  if: You begin to have strong, frequent contractions Your water breaks.  Sometimes it is a big gush of fluid, sometimes it is just a trickle that keeps getting your underwear wet or running down your legs You have vaginal bleeding.  It is normal to have a small amount of spotting if your cervix was checked.  You do not feel your baby moving like normal.  If you do not, get something to eat and drink and lay down and focus on feeling your baby move.   If your baby is still not moving like normal, you should call the office or go to MAU. Any other obstetric concerns.  

## 2022-02-03 ENCOUNTER — Encounter: Admitting: Student

## 2022-02-03 ENCOUNTER — Ambulatory Visit (INDEPENDENT_AMBULATORY_CARE_PROVIDER_SITE_OTHER): Admitting: Obstetrics and Gynecology

## 2022-02-03 ENCOUNTER — Other Ambulatory Visit (HOSPITAL_COMMUNITY)
Admission: RE | Admit: 2022-02-03 | Discharge: 2022-02-03 | Disposition: A | Discharge status: Home / Self Care | Source: Ambulatory Visit | Attending: Obstetrics & Gynecology | Admitting: Obstetrics & Gynecology

## 2022-02-03 VITALS — BP 102/67 | HR 64 | Wt 167.0 lb

## 2022-02-03 DIAGNOSIS — O34219 Maternal care for unspecified type scar from previous cesarean delivery: Secondary | ICD-10-CM

## 2022-02-03 DIAGNOSIS — O0993 Supervision of high risk pregnancy, unspecified, third trimester: Secondary | ICD-10-CM | POA: Insufficient documentation

## 2022-02-03 DIAGNOSIS — O09523 Supervision of elderly multigravida, third trimester: Secondary | ICD-10-CM

## 2022-02-03 DIAGNOSIS — Z3A36 36 weeks gestation of pregnancy: Secondary | ICD-10-CM

## 2022-02-03 DIAGNOSIS — O24410 Gestational diabetes mellitus in pregnancy, diet controlled: Secondary | ICD-10-CM

## 2022-02-03 NOTE — Progress Notes (Signed)
   PRENATAL VISIT NOTE  Subjective:  Brianna Chapman is a 46 y.o. [id number] at [redacted]w[redacted]d being seen today for ongoing prenatal care.  She is currently monitored for the following issues for this high-risk pregnancy and has AMA (advanced maternal age) multigravida 51+; Sickle cell trait ([place]); History of 2 cesarean sections; Supervision of high-risk pregnancy; and Gestational diabetes mellitus (GDM) in third trimester on their problem list.  Patient doing well with no acute concerns today. She reports no complaints.  Contractions: Not present. Vag. Bleeding: None.  Movement: Present. Denies leaking of fluid.   The following portions of the patient's history were reviewed and updated as appropriate: allergies, current medications, past family history, past medical history, past social history, past surgical history and problem list. Problem list updated.  Objective:   Vitals:   02/03/22 1600  BP: 102/67  Pulse: 64  Weight: 167 lb (75.8 kg)    Fetal Status: Fetal Heart Rate (bpm): 140 Fundal Height: 36 cm Movement: Present     General:  Alert, oriented and cooperative. Patient is in no acute distress.  Skin: Skin is warm and dry. No rash noted.   Cardiovascular: Normal heart rate noted  Respiratory: Normal respiratory effort, no problems with respiration noted  Abdomen: Soft, gravid, appropriate for gestational age.  Pain/Pressure: Present     Pelvic: Cervical exam deferred        Extremities: Normal range of motion.     Mental Status:  Normal mood and affect. Normal behavior. Normal judgment and thought content.   Assessment and Plan:  Pregnancy: [id number] at [redacted]w[redacted]d  1. [redacted] weeks gestation of pregnancy   2. Diet controlled gestational diabetes mellitus (GDM) in third trimester Good blood sugar control noted FBS: 60-94 PPBS: 62-127  3. Multigravida of advanced maternal age in third trimester   4. Previous cesarean section complicating pregnancy Pt with c/s x 2, repeat with scar  steroid injection on 02/24/22  5. Supervision of high risk pregnancy in third trimester Continue routine prenatal care  - Strep Gp B NAA - GC/Chlamydia probe amp ([place])not at [facility]  Preterm labor symptoms and general obstetric precautions including but not limited to vaginal bleeding, contractions, leaking of fluid and fetal movement were reviewed in detail with the patient.  Please refer to After Visit Summary for other counseling recommendations.   Return in about 1 week (around 02/10/2022) for [facility], in person.   Lynnda Shields, MD Faculty Attending Center for [facility]

## 2022-02-04 ENCOUNTER — Ambulatory Visit: Admitting: *Deleted

## 2022-02-04 ENCOUNTER — Ambulatory Visit: Attending: Obstetrics

## 2022-02-04 VITALS — BP 100/63 | HR 64

## 2022-02-04 DIAGNOSIS — O09523 Supervision of elderly multigravida, third trimester: Secondary | ICD-10-CM | POA: Diagnosis not present

## 2022-02-04 DIAGNOSIS — O0993 Supervision of high risk pregnancy, unspecified, third trimester: Secondary | ICD-10-CM

## 2022-02-04 DIAGNOSIS — O24410 Gestational diabetes mellitus in pregnancy, diet controlled: Secondary | ICD-10-CM | POA: Diagnosis not present

## 2022-02-04 DIAGNOSIS — Z148 Genetic carrier of other disease: Secondary | ICD-10-CM | POA: Diagnosis not present

## 2022-02-04 DIAGNOSIS — Z3A36 36 weeks gestation of pregnancy: Secondary | ICD-10-CM

## 2022-02-04 DIAGNOSIS — O34219 Maternal care for unspecified type scar from previous cesarean delivery: Secondary | ICD-10-CM

## 2022-02-04 LAB — GC/CHLAMYDIA PROBE AMP (~~LOC~~) NOT AT ARMC
Chlamydia: NEGATIVE
Comment: NEGATIVE
Comment: NORMAL
Neisseria Gonorrhea: NEGATIVE

## 2022-02-05 LAB — STREP GP B NAA: Strep Gp B NAA: NEGATIVE

## 2022-02-10 ENCOUNTER — Ambulatory Visit (INDEPENDENT_AMBULATORY_CARE_PROVIDER_SITE_OTHER): Admitting: Obstetrics and Gynecology

## 2022-02-10 ENCOUNTER — Encounter: Payer: Self-pay | Admitting: Obstetrics and Gynecology

## 2022-02-10 ENCOUNTER — Encounter (HOSPITAL_COMMUNITY): Payer: Self-pay

## 2022-02-10 VITALS — BP 102/66 | HR 67 | Wt 167.0 lb

## 2022-02-10 DIAGNOSIS — O09523 Supervision of elderly multigravida, third trimester: Secondary | ICD-10-CM

## 2022-02-10 DIAGNOSIS — Z98891 History of uterine scar from previous surgery: Secondary | ICD-10-CM

## 2022-02-10 DIAGNOSIS — D573 Sickle-cell trait: Secondary | ICD-10-CM

## 2022-02-10 DIAGNOSIS — Z3A37 37 weeks gestation of pregnancy: Secondary | ICD-10-CM

## 2022-02-10 DIAGNOSIS — O24410 Gestational diabetes mellitus in pregnancy, diet controlled: Secondary | ICD-10-CM

## 2022-02-10 DIAGNOSIS — O0993 Supervision of high risk pregnancy, unspecified, third trimester: Secondary | ICD-10-CM

## 2022-02-10 NOTE — Progress Notes (Signed)
Subjective:  Shaquela Weichert is a 46 y.o. [id number] at [redacted]w[redacted]d being seen today for ongoing prenatal care.  She is currently monitored for the following issues for this high-risk pregnancy and has AMA (advanced maternal age) multigravida 25+; Sickle cell trait ([place]); History of 2 cesarean sections; Supervision of high-risk pregnancy; and Gestational diabetes mellitus (GDM) in third trimester on their problem list.  Patient reports general discomforts of pregnancy.  Contractions: Not present. Vag. Bleeding: None.  Movement: Present. Denies leaking of fluid.   The following portions of the patient's history were reviewed and updated as appropriate: allergies, current medications, past family history, past medical history, past social history, past surgical history and problem list. Problem list updated.  Objective:   Vitals:   02/10/22 1553  BP: 102/66  Pulse: 67  Weight: 167 lb (75.8 kg)    Fetal Status: Fetal Heart Rate (bpm): 141   Movement: Present     General:  Alert, oriented and cooperative. Patient is in no acute distress.  Skin: Skin is warm and dry. No rash noted.   Cardiovascular: Normal heart rate noted  Respiratory: Normal respiratory effort, no problems with respiration noted  Abdomen: Soft, gravid, appropriate for gestational age. Pain/Pressure: Present     Pelvic:  Cervical exam deferred        Extremities: Normal range of motion.  Edema: None  Mental Status: Normal mood and affect. Normal behavior. Normal judgment and thought content.   Urinalysis:      Assessment and Plan:  Pregnancy: [id number] at [redacted]w[redacted]d  1. Supervision of high risk pregnancy in third trimester Labor precautions  2. Diet controlled gestational diabetes mellitus (GDM) in third trimester CBG's in goal range BPP and growth scan tomorrow  3. History of 2 cesarean sections For repeat Scheduled 02/24/22  4. Sickle cell trait (H[facility]) Stable  5. Multigravida of advanced maternal age in third  trimester Stable  Term labor symptoms and general obstetric precautions including but not limited to vaginal bleeding, contractions, leaking of fluid and fetal movement were reviewed in detail with the patient. Please refer to After Visit Summary for other counseling recommendations.  Return in about 1 week (around 02/17/2022) for OB visit, face to face, MD only.   Chancy Milroy, MD

## 2022-02-10 NOTE — Patient Instructions (Signed)
Cesarean Delivery Cesarean birth, or cesarean delivery, is the surgical delivery of a baby through an incision in the abdomen and the uterus. This may be referred to as a C-section. This procedure may be scheduled ahead of time, or it may be done in an emergency situation. Tell a health care provider about: Any allergies you have. All medicines you are taking, including vitamins, herbs, eye drops, creams, and over-the-counter medicines. Any problems you or family members have had with anesthetic medicines. Any bleeding problems you have. Any surgeries you have had. Any medical conditions you have. Whether you or any members of your family have a history of deep vein thrombosis (DVT) or pulmonary embolism (PE). What are the risks? Generally, this is a safe procedure. However, problems may occur, including: Infection. Bleeding. Allergic reactions to medicines. Damage to other structures or organs. Blood clots. Injury to your baby. What happens before the procedure? Medicines Ask your health care provider about: Changing or stopping your regular medicines. This is especially important if you are taking diabetes medicines or blood thinners. Taking medicines such as aspirin and ibuprofen. These medicines can thin your blood. Do not take these medicines unless your health care provider tells you to take them. Taking over-the-counter medicines, vitamins, herbs, and supplements. Tests Depending on the reason for your cesarean delivery, you may have a physical exam or additional testing, such as an ultrasound. You may have your blood or urine tested. General instructions Follow instructions from your health care provider about eating or drinking restrictions. Do not shave your pubic area if you know that you are going to have a cesarean delivery. Shaving before the procedure may increase your risk of infection. Plan to have someone take you home from the hospital. Ask your health care provider  what steps will be taken to prevent infection. These may include: Washing skin with a germ-killing soap. Taking antibiotic medicine. Questions for your health care provider Ask your health care provider about: Your pain management plan. This is especially important if you plan to breastfeed your baby. How long you will be in the hospital after the procedure. Any concerns you may have about receiving blood products, if you need them during the procedure. Cord blood banking, if you plan to collect your baby's umbilical cord blood. You may also want to ask your health care provider: Whether you will be able to hold or breastfeed your baby while you are still in the operating room. Whether your baby can stay with you immediately after the procedure and during your recovery. Whether a family member or a person of your choice can go with you into the operating room and stay with you during the procedure, immediately after the procedure, and during your recovery. What happens during the procedure?  An IV will be inserted into one of your veins. Fluid and medicines, such as antibiotics, will be given before the surgery. Fetal monitors will be placed on your abdomen to check your baby's heart rate. You may be given a warming gown to wear to keep your temperature stable. A catheter may be inserted into your bladder through your urethra. This drains your urine during the procedure. You may be given one or more of the following: A medicine to numb the area (local anesthetic). A medicine to make you fall asleep (general anesthetic). A medicine (regional anesthetic) that is injected into your back or through a small thin tube placed in your back (spinal anesthetic or epidural anesthetic). This numbs everything below the  injection site and allows you to stay awake during your procedure. If this makes you feel nauseous, tell your health care provider. Medicines will be available to help reduce any nausea you  may feel. An incision will be made in your abdomen, and then in your uterus. If you are awake during your procedure, you may feel tugging and pulling in your abdomen, but you should not feel pain. If you feel pain, tell your health care provider immediately. Your baby will be removed from your uterus. You may feel more pressure or pushing while this happens. Immediately after birth, your baby will be dried and kept warm. You may be able to hold and breastfeed your baby. The umbilical cord may be clamped and cut during this time. This usually occurs after waiting a period of 1-2 minutes after delivery. Your placenta will be removed from your uterus. Your incisions will be closed with stitches (sutures). Staples, skin glue, or adhesive strips may also be applied to the incision in your abdomen. Bandages (dressings) may be placed over the incision in your abdomen. The procedure may vary among health care providers and hospitals. What happens after the procedure? Your blood pressure, heart rate, breathing rate, and blood oxygen level will be monitored until you leave the hospital or clinic. You may continue to receive fluids and medicines through an IV. You will have some pain. Medicines will be available to help control your pain. To help prevent blood clots: You may be given medicines. You may have to wear compression stockings or devices. You will be encouraged to walk around when you are able. Hospital staff will encourage and support bonding with your baby. Your hospital may have you and your baby stay in the same room (rooming in) during your hospital stay to encourage successful bonding and breastfeeding. You may be encouraged to cough and breathe deeply often. This helps to prevent lung problems. If you have a catheter draining your urine, it will be removed as soon as possible after your procedure. Summary Cesarean birth, or cesarean delivery, is the surgical delivery of a baby through an  incision in the abdomen and the uterus. Follow instructions from your health care provider about eating or drinking restrictions before the procedure. You will have some pain after the procedure. Medicines will be available to help control your pain. Hospital staff will encourage and support bonding with your baby after the procedure. Your hospital may have you and your baby stay in the same room (rooming in) during your hospital stay to encourage successful bonding and breastfeeding. This information is not intended to replace advice given to you by your health care provider. Make sure you discuss any questions you have with your health care provider. Document Revised: 12/03/2020 Document Reviewed: 12/03/2020 Elsevier Patient Education  2023 [employer] Inc.  

## 2022-02-10 NOTE — Patient Instructions (Signed)
Briella Hobday  02/10/2022   Your procedure is scheduled on:  01/25/2022  Arrive at 39 at Entrance C on [address] at [facility]  and [employer]. You are invited to use the FREE valet parking or use the Visitor's parking deck.  Pick up the phone at the desk and dial [phone].  Call this number if you have problems the morning of surgery: [phone]  Remember:   Do not eat food:(After Midnight) Desps de medianoche.  Do not drink clear liquids: (After Midnight) Desps de medianoche.  Take these medicines the morning of surgery with A SIP OF WATER:  none   Do not wear jewelry, make-up or nail polish.  Do not wear lotions, powders, or perfumes. Do not wear deodorant.  Do not shave 48 hours prior to surgery.  Do not bring valuables to the hospital.  [facility] is not   responsible for any belongings or valuables brought to the hospital.  Contacts, dentures or bridgework may not be worn into surgery.  Leave suitcase in the car. After surgery it may be brought to your room.  For patients admitted to the hospital, checkout time is 11:00 AM the day of              discharge.      Please read over the following fact sheets that you were given:     Preparing for Surgery

## 2022-02-11 ENCOUNTER — Ambulatory Visit: Attending: Maternal & Fetal Medicine

## 2022-02-11 ENCOUNTER — Ambulatory Visit: Admitting: *Deleted

## 2022-02-11 VITALS — BP 102/61 | HR 66

## 2022-02-11 DIAGNOSIS — O09523 Supervision of elderly multigravida, third trimester: Secondary | ICD-10-CM | POA: Diagnosis not present

## 2022-02-11 DIAGNOSIS — O0993 Supervision of high risk pregnancy, unspecified, third trimester: Secondary | ICD-10-CM | POA: Insufficient documentation

## 2022-02-11 DIAGNOSIS — O24419 Gestational diabetes mellitus in pregnancy, unspecified control: Secondary | ICD-10-CM | POA: Insufficient documentation

## 2022-02-11 DIAGNOSIS — Z3A37 37 weeks gestation of pregnancy: Secondary | ICD-10-CM

## 2022-02-11 DIAGNOSIS — O285 Abnormal chromosomal and genetic finding on antenatal screening of mother: Secondary | ICD-10-CM

## 2022-02-11 DIAGNOSIS — O34219 Maternal care for unspecified type scar from previous cesarean delivery: Secondary | ICD-10-CM

## 2022-02-11 DIAGNOSIS — D573 Sickle-cell trait: Secondary | ICD-10-CM

## 2022-02-11 DIAGNOSIS — O24410 Gestational diabetes mellitus in pregnancy, diet controlled: Secondary | ICD-10-CM | POA: Diagnosis not present

## 2022-02-17 ENCOUNTER — Ambulatory Visit (INDEPENDENT_AMBULATORY_CARE_PROVIDER_SITE_OTHER): Admitting: Obstetrics and Gynecology

## 2022-02-17 ENCOUNTER — Encounter: Payer: Self-pay | Admitting: Obstetrics and Gynecology

## 2022-02-17 VITALS — BP 101/67 | HR 64 | Wt 168.8 lb

## 2022-02-17 DIAGNOSIS — O0993 Supervision of high risk pregnancy, unspecified, third trimester: Secondary | ICD-10-CM

## 2022-02-17 DIAGNOSIS — Z98891 History of uterine scar from previous surgery: Secondary | ICD-10-CM

## 2022-02-17 DIAGNOSIS — O24410 Gestational diabetes mellitus in pregnancy, diet controlled: Secondary | ICD-10-CM

## 2022-02-17 DIAGNOSIS — Z3A38 38 weeks gestation of pregnancy: Secondary | ICD-10-CM

## 2022-02-17 NOTE — Progress Notes (Signed)
Patient presents for ROB visit. No concerns at this time.   

## 2022-02-17 NOTE — Progress Notes (Signed)
   LOW-RISK PREGNANCY OFFICE VISIT Patient name: Brianna Chapman MRN [id number]  Date of birth: 08/21/1975 Chief Complaint:   Routine Prenatal Visit  History of Present Illness:   Brianna Chapman is a 46 y.o. [id number] female at [redacted]w[redacted]d with an Estimated Date of Delivery: 03/02/22 being seen today for ongoing management of a low-risk pregnancy.  Today she reports no complaints. Contractions: Irritability. Vag. Bleeding: None.  Movement: Present. denies leaking of fluid. Review of Systems:   Pertinent items are noted in HPI Denies abnormal vaginal discharge w/ itching/odor/irritation, headaches, visual changes, shortness of breath, chest pain, abdominal pain, severe nausea/vomiting, or problems with urination or bowel movements unless otherwise stated above. Pertinent History Reviewed:  Reviewed past medical,surgical, social, obstetrical and family history.  Reviewed problem list, medications and allergies. Physical Assessment:   Vitals:   02/17/22 1623  BP: 101/67  Pulse: 64  Weight: 168 lb 12.8 oz (76.6 kg)  Body mass index is 28.97 kg/m.        Physical Examination:   General appearance: Well appearing, and in no distress  Mental status: Alert, oriented to person, place, and time  Skin: Warm & dry  Cardiovascular: Normal heart rate noted  Respiratory: Normal respiratory effort, no distress  Abdomen: Soft, gravid, nontender  Pelvic: Cervical exam deferred         Extremities: Edema: None  Fetal Status: Fetal Heart Rate (bpm): 139   Movement: Present    No results found for this or any previous visit (from the past 24 hour(s)).  Assessment & Plan:  1) Low-risk pregnancy [id number] at [redacted]w[redacted]d with an Estimated Date of Delivery: 03/02/22   2) Supervision of high risk pregnancy in third trimester - Discussed labor s/sx's  3) Diet controlled gestational diabetes mellitus (GDM) in third trimester - BS WNL per pt  4) History of 2 cesarean sections - Repeat C/S scheduled on  10/11/02023  5) [redacted] weeks gestation of pregnancy    Meds: No orders of the defined types were placed in this encounter.  Labs/procedures today: none  Plan:  Continue routine obstetrical care   Reviewed: Term labor symptoms and general obstetric precautions including but not limited to vaginal bleeding, contractions, leaking of fluid and fetal movement were reviewed in detail with the patient.  All questions were answered. Has home bp cuff. Check bp weekly, let us know if >140/90.   Follow-up: No follow-ups on file.  No orders of the defined types were placed in this encounter.  Laury Deep MSN, CNM 02/17/2022 4:31 PM

## 2022-02-18 ENCOUNTER — Ambulatory Visit: Admitting: *Deleted

## 2022-02-18 ENCOUNTER — Ambulatory Visit: Attending: Maternal & Fetal Medicine

## 2022-02-18 VITALS — BP 105/63 | HR 71

## 2022-02-18 DIAGNOSIS — O285 Abnormal chromosomal and genetic finding on antenatal screening of mother: Secondary | ICD-10-CM | POA: Diagnosis not present

## 2022-02-18 DIAGNOSIS — O0993 Supervision of high risk pregnancy, unspecified, third trimester: Secondary | ICD-10-CM | POA: Diagnosis present

## 2022-02-18 DIAGNOSIS — O34219 Maternal care for unspecified type scar from previous cesarean delivery: Secondary | ICD-10-CM

## 2022-02-18 DIAGNOSIS — O24419 Gestational diabetes mellitus in pregnancy, unspecified control: Secondary | ICD-10-CM | POA: Diagnosis present

## 2022-02-18 DIAGNOSIS — O09523 Supervision of elderly multigravida, third trimester: Secondary | ICD-10-CM | POA: Diagnosis not present

## 2022-02-18 DIAGNOSIS — Z3A38 38 weeks gestation of pregnancy: Secondary | ICD-10-CM

## 2022-02-18 DIAGNOSIS — D573 Sickle-cell trait: Secondary | ICD-10-CM

## 2022-02-18 DIAGNOSIS — O24410 Gestational diabetes mellitus in pregnancy, diet controlled: Secondary | ICD-10-CM | POA: Diagnosis not present

## 2022-02-22 ENCOUNTER — Encounter (HOSPITAL_COMMUNITY)
Admission: RE | Admit: 2022-02-22 | Discharge: 2022-02-22 | Disposition: A | Discharge status: Home / Self Care | Source: Ambulatory Visit | Attending: Family Medicine | Admitting: Family Medicine

## 2022-02-22 DIAGNOSIS — O34219 Maternal care for unspecified type scar from previous cesarean delivery: Secondary | ICD-10-CM | POA: Insufficient documentation

## 2022-02-22 DIAGNOSIS — O0993 Supervision of high risk pregnancy, unspecified, third trimester: Secondary | ICD-10-CM | POA: Insufficient documentation

## 2022-02-22 DIAGNOSIS — Z01812 Encounter for preprocedural laboratory examination: Secondary | ICD-10-CM | POA: Insufficient documentation

## 2022-02-22 LAB — CBC
HCT: 31.7 % — ABNORMAL LOW (ref 36.0–46.0)
Hemoglobin: 10.6 g/dL — ABNORMAL LOW (ref 12.0–15.0)
MCH: 27.2 pg (ref 26.0–34.0)
MCHC: 33.4 g/dL (ref 30.0–36.0)
MCV: 81.3 fL (ref 80.0–100.0)
Platelets: 154 10*3/uL (ref 150–400)
RBC: 3.9 MIL/uL (ref 3.87–5.11)
RDW: 15 % (ref 11.5–15.5)
WBC: 8.9 10*3/uL (ref 4.0–10.5)
nRBC: 0 % (ref 0.0–0.2)

## 2022-02-22 LAB — RAPID HIV SCREEN (HIV 1/2 AB+AG)
HIV 1/2 Antibodies: NONREACTIVE
HIV-1 P24 Antigen - HIV24: NONREACTIVE

## 2022-02-22 LAB — TYPE AND SCREEN
ABO/RH(D): A POS
Antibody Screen: NEGATIVE

## 2022-02-23 NOTE — H&P (Addendum)
OBSTETRIC ADMISSION HISTORY AND PHYSICAL  Brianna Chapman is a 46 y.o. female [phone] with IUP at [redacted]w[redacted]d by LMP presenting for rLTCS. She reports +FMs, No LOF, no VB, no blurry vision, headaches or peripheral edema, and RUQ pain.  She plans on breast feeding. She request (FOB vasectomy) for birth control. She received her prenatal care at [place]: By LMP --->  Estimated Date of Delivery: 03/02/22  Sono:    @[redacted]w[redacted]d , CWD, normal anatomy, cephalic presentation, anterior placental lie, 2999g, 57% EFW   Prenatal History/Complications:  -Hx of ppHTN -SCT -AMA -A1GDM -Prior c/s  Past Medical History: Past Medical History:  Diagnosis Date   Keloid    on c-section scar   Medical history non-contributory    Postpartum hypertension     Past Surgical History: Past Surgical History:  Procedure Laterality Date   CESAREAN SECTION  12/05/2008   CESAREAN SECTION N/A 02/25/2020   Procedure: CESAREAN SECTION;  Surgeon: Gwynne Edinger, MD;  Location: [facility];  Service: Obstetrics;  Laterality: N/A;    Obstetrical History: OB History     Gravida  4   Para  2   Term  2   Preterm      AB  1   Living  2      SAB  1   IAB      Ectopic      Multiple  0   Live Births  2           Social History Social History   Socioeconomic History   Marital status: Married    Spouse name: Not on file   Number of children: Not on file   Years of education: Not on file   Highest education level: Not on file  Occupational History   Not on file  Tobacco Use   Smoking status: Never   Smokeless tobacco: Never  Vaping Use   Vaping Use: Never used  Substance and Sexual Activity   Alcohol use: Not Currently   Drug use: Not Currently   Sexual activity: Yes    Partners: Male    Birth control/protection: None  Other Topics Concern   Not on file  Social History Narrative   Not on file   Social Determinants of Health   Financial Resource Strain: Not on file  Food  Insecurity: Not on file  Transportation Needs: Not on file  Physical Activity: Not on file  Stress: Not on file  Social Connections: Not on file    Family History: Family History  Problem Relation Age of Onset   Asthma Neg Hx    Cancer Neg Hx    Diabetes Neg Hx    Heart disease Neg Hx    Hypertension Neg Hx    Stroke Neg Hx     Allergies: No Known Allergies  No medications prior to admission.     Review of Systems   All systems reviewed and negative except as stated in HPI  Last menstrual period 05/26/2021, unknown if currently breastfeeding. General appearance: alert, cooperative, and no distress Lungs: normal effort Heart: regular rate noted Abdomen: soft, non-tender; bowel sounds normal Pelvic: Deferred. Extremities: Homans sign is negative, no sign of DVT Presentation: cephalic by leopolds Fetal monitoringBaseline: 135 bpm     Prenatal labs: ABO, Rh: --/--/A POS (10/09 1021) Antibody: NEG (10/09 1021) Rubella: 22.00 (04/25 1539) RPR: Non Reactive (07/25 1026)  HBsAg: Negative (04/25 1539)  HIV: NON REACTIVE (10/09 1015)  GBS: Negative/-- (09/20  1629)  1 hr Glucola 187 Genetic screening  LR, neg Anatomy US wnl  Prenatal Transfer Tool  Maternal Diabetes: Yes:  Diabetes Type:  Diet controlled Genetic Screening: Normal Maternal Ultrasounds/Referrals: Normal Fetal Ultrasounds or other Referrals:  None Maternal Substance Abuse:  No Significant Maternal Medications:  None Significant Maternal Lab Results:  Group B Strep negative Number of Prenatal Visits:greater than 3 verified prenatal visits Other Comments:  None  No results found for this or any previous visit (from the past 24 hour(s)).  Patient Active Problem List   Diagnosis Date Noted   Gestational diabetes mellitus (GDM) in third trimester 01/05/2022   Supervision of high-risk pregnancy 08/18/2021   History of 2 cesarean sections 02/25/2020   AMA (advanced maternal age) multigravida 40+  02/05/2020   Sickle cell trait (HCC) 02/05/2020    Assessment/Plan:  Brianna Chapman is a 46 y.o. [id number] at [redacted]w[redacted]d here for rLTCS.   The risks of surgery were discussed with the patient including but were not limited to: bleeding which may require transfusion or reoperation; infection which may require antibiotics; injury to bowel, bladder, ureters or other surrounding organs; injury to the fetus; need for additional procedures including hysterectomy in the event of a life-threatening hemorrhage; formation of adhesions; placental abnormalities wth subsequent pregnancies; incisional problems; thromboembolic phenomenon and other postoperative/anesthesia complications.   The patient concurred with the proposed plan, giving informed written consent for the procedures.  Antibiotics ordered.  To OR when ready.   Hgb 10.6 on 10/9  A1GDM CBG 74 this morning at home.   Hx of ppHTN -Monitor BP closely in pp period.  #ID:  GBS neg #MOF: Breast #MOC: declined, vasectomy #Circ:  Brianna Chapman, girl  Nichola Cieslinski Autry-Lott, DO  02/23/2022, 9:07 PM

## 2022-02-23 NOTE — Anesthesia Preprocedure Evaluation (Signed)
Anesthesia Evaluation    Reviewed: Allergy & Precautions, H&P , Patient's Chart, lab work & pertinent test results, reviewed documented beta blocker date and time   Airway Mallampati: I  TM Distance: >3 FB Neck ROM: full    Dental no notable dental hx. (+) Teeth Intact, Dental Advisory Given   Pulmonary neg pulmonary ROS,    Pulmonary exam normal breath sounds clear to auscultation       Cardiovascular hypertension, negative cardio ROS Normal cardiovascular exam Rhythm:regular Rate:Normal     Neuro/Psych negative neurological ROS  negative psych ROS   GI/Hepatic negative GI ROS, Neg liver ROS,   Endo/Other  negative endocrine ROS  Renal/GU negative Renal ROS  negative genitourinary   Musculoskeletal negative musculoskeletal ROS (+)   Abdominal   Peds negative pediatric ROS (+)  Hematology  (+) Blood dyscrasia, anemia ,   Anesthesia Other Findings   Reproductive/Obstetrics (+) Pregnancy                             Anesthesia Physical  Anesthesia Plan  ASA: 2  Anesthesia Plan: Spinal   Post-op Pain Management:    Induction:   PONV Risk Score and Plan: 2  Airway Management Planned: Natural Airway  Additional Equipment: None  Intra-op Plan:   Post-operative Plan:   Informed Consent: I have reviewed the patients History and Physical, chart, labs and discussed the procedure including the risks, benefits and alternatives for the proposed anesthesia with the patient or authorized representative who has indicated his/her understanding and acceptance.       Plan Discussed with: Anesthesiologist and CRNA  Anesthesia Plan Comments:         Anesthesia Quick Evaluation

## 2022-02-24 ENCOUNTER — Inpatient Hospital Stay (HOSPITAL_COMMUNITY)
Admission: RE | Admit: 2022-02-24 | Discharge: 2022-02-26 | DRG: 788 | Disposition: A | Discharge status: Home / Self Care | Attending: Family Medicine | Admitting: Family Medicine

## 2022-02-24 ENCOUNTER — Inpatient Hospital Stay (HOSPITAL_COMMUNITY): Admitting: Anesthesiology

## 2022-02-24 ENCOUNTER — Encounter (HOSPITAL_COMMUNITY): Payer: Self-pay | Admitting: Family Medicine

## 2022-02-24 ENCOUNTER — Encounter (HOSPITAL_COMMUNITY)
Admission: RE | Disposition: A | Discharge status: Home / Self Care | Payer: Self-pay | Source: Home / Self Care | Attending: Family Medicine

## 2022-02-24 ENCOUNTER — Other Ambulatory Visit: Payer: Self-pay

## 2022-02-24 ENCOUNTER — Encounter: Admitting: Obstetrics and Gynecology

## 2022-02-24 DIAGNOSIS — O24420 Gestational diabetes mellitus in childbirth, diet controlled: Secondary | ICD-10-CM | POA: Diagnosis present

## 2022-02-24 DIAGNOSIS — O34211 Maternal care for low transverse scar from previous cesarean delivery: Principal | ICD-10-CM | POA: Diagnosis present

## 2022-02-24 DIAGNOSIS — O24410 Gestational diabetes mellitus in pregnancy, diet controlled: Secondary | ICD-10-CM

## 2022-02-24 DIAGNOSIS — D573 Sickle-cell trait: Secondary | ICD-10-CM | POA: Diagnosis present

## 2022-02-24 DIAGNOSIS — O9902 Anemia complicating childbirth: Secondary | ICD-10-CM | POA: Diagnosis present

## 2022-02-24 DIAGNOSIS — Z3A39 39 weeks gestation of pregnancy: Secondary | ICD-10-CM

## 2022-02-24 DIAGNOSIS — Z98891 History of uterine scar from previous surgery: Secondary | ICD-10-CM

## 2022-02-24 DIAGNOSIS — O34219 Maternal care for unspecified type scar from previous cesarean delivery: Secondary | ICD-10-CM

## 2022-02-24 DIAGNOSIS — O321XX0 Maternal care for breech presentation, not applicable or unspecified: Secondary | ICD-10-CM

## 2022-02-24 DIAGNOSIS — L910 Hypertrophic scar: Secondary | ICD-10-CM

## 2022-02-24 DIAGNOSIS — O0993 Supervision of high risk pregnancy, unspecified, third trimester: Secondary | ICD-10-CM

## 2022-02-24 DIAGNOSIS — O09529 Supervision of elderly multigravida, unspecified trimester: Secondary | ICD-10-CM

## 2022-02-24 DIAGNOSIS — O24419 Gestational diabetes mellitus in pregnancy, unspecified control: Secondary | ICD-10-CM | POA: Diagnosis present

## 2022-02-24 LAB — GLUCOSE, CAPILLARY
Glucose-Capillary: 89 mg/dL (ref 70–99)
Glucose-Capillary: 93 mg/dL (ref 70–99)

## 2022-02-24 LAB — RPR: RPR Ser Ql: NONREACTIVE

## 2022-02-24 SURGERY — Surgical Case
Anesthesia: Spinal

## 2022-02-24 MED ORDER — DIPHENHYDRAMINE HCL 50 MG/ML IJ SOLN: 12.5000 mg | INTRAMUSCULAR | Status: DC | PRN

## 2022-02-24 MED ORDER — KETOROLAC TROMETHAMINE 30 MG/ML IJ SOLN: 30.0000 mg | Freq: Four times a day (QID) | INTRAMUSCULAR | Status: AC | PRN

## 2022-02-24 MED ORDER — TETANUS-DIPHTH-ACELL PERTUSSIS 5-2.5-18.5 LF-MCG/0.5 IM SUSY: 0.5000 mL | PREFILLED_SYRINGE | Freq: Once | INTRAMUSCULAR | Status: DC

## 2022-02-24 MED ORDER — ONDANSETRON HCL 4 MG/2ML IJ SOLN
INTRAMUSCULAR | Status: AC
  Filled 2022-02-24: qty 2

## 2022-02-24 MED ORDER — LACTATED RINGERS IV BOLUS
1000.0000 mL | Freq: Once | INTRAVENOUS | Status: AC
  Administered 2022-02-24: 1000 mL via INTRAVENOUS

## 2022-02-24 MED ORDER — SIMETHICONE 80 MG PO CHEW: 80.0000 mg | CHEWABLE_TABLET | ORAL | Status: DC | PRN

## 2022-02-24 MED ORDER — OXYTOCIN-SODIUM CHLORIDE 30-0.9 UT/500ML-% IV SOLN
INTRAVENOUS | Status: AC
  Filled 2022-02-24: qty 500

## 2022-02-24 MED ORDER — ACETAMINOPHEN 10 MG/ML IV SOLN
INTRAVENOUS | Status: DC | PRN
  Administered 2022-02-24: 1000 mg via INTRAVENOUS

## 2022-02-24 MED ORDER — SODIUM CHLORIDE 0.9 % IR SOLN
Status: DC | PRN
  Administered 2022-02-24: 1

## 2022-02-24 MED ORDER — KETOROLAC TROMETHAMINE 30 MG/ML IJ SOLN
30.0000 mg | Freq: Four times a day (QID) | INTRAMUSCULAR | Status: AC
  Administered 2022-02-24 – 2022-02-25 (×3): 30 mg via INTRAVENOUS
  Filled 2022-02-24 (×3): qty 1

## 2022-02-24 MED ORDER — EPHEDRINE SULFATE (PRESSORS) 50 MG/ML IJ SOLN
INTRAMUSCULAR | Status: DC | PRN
  Administered 2022-02-24: 5 mg via INTRAVENOUS

## 2022-02-24 MED ORDER — PHENYLEPHRINE HCL-NACL 20-0.9 MG/250ML-% IV SOLN
INTRAVENOUS | Status: DC | PRN
  Administered 2022-02-24: 60 ug/min via INTRAVENOUS

## 2022-02-24 MED ORDER — MEPERIDINE HCL 25 MG/ML IJ SOLN: 6.2500 mg | INTRAMUSCULAR | Status: DC | PRN

## 2022-02-24 MED ORDER — ONDANSETRON HCL 4 MG/2ML IJ SOLN
INTRAMUSCULAR | Status: DC | PRN
  Administered 2022-02-24: 4 mg via INTRAVENOUS

## 2022-02-24 MED ORDER — SCOPOLAMINE 1 MG/3DAYS TD PT72
MEDICATED_PATCH | TRANSDERMAL | Status: AC
  Filled 2022-02-24: qty 1

## 2022-02-24 MED ORDER — NALOXONE HCL 4 MG/10ML IJ SOLN: 1.0000 ug/kg/h | INTRAVENOUS | Status: DC | PRN

## 2022-02-24 MED ORDER — ARTIFICIAL TEARS OPHTHALMIC OINT
TOPICAL_OINTMENT | OPHTHALMIC | Status: AC
  Filled 2022-02-24: qty 3.5

## 2022-02-24 MED ORDER — OXYTOCIN-SODIUM CHLORIDE 30-0.9 UT/500ML-% IV SOLN
INTRAVENOUS | Status: DC | PRN
  Administered 2022-02-24: 400 mL via INTRAVENOUS

## 2022-02-24 MED ORDER — ACETAMINOPHEN 500 MG PO TABS
1000.0000 mg | ORAL_TABLET | Freq: Four times a day (QID) | ORAL | Status: DC
  Administered 2022-02-24 – 2022-02-26 (×7): 1000 mg via ORAL
  Filled 2022-02-24 (×8): qty 2

## 2022-02-24 MED ORDER — TRIAMCINOLONE ACETONIDE 40 MG/ML IJ SUSP: 40.0000 mg | Freq: Once | INTRAMUSCULAR | Status: DC

## 2022-02-24 MED ORDER — CEFAZOLIN SODIUM-DEXTROSE 2-4 GM/100ML-% IV SOLN
2.0000 g | INTRAVENOUS | Status: AC
  Administered 2022-02-24: 2 g via INTRAVENOUS

## 2022-02-24 MED ORDER — NALOXONE HCL 0.4 MG/ML IJ SOLN: 0.4000 mg | INTRAMUSCULAR | Status: DC | PRN

## 2022-02-24 MED ORDER — MORPHINE SULFATE (PF) 0.5 MG/ML IJ SOLN
INTRAMUSCULAR | Status: AC
  Filled 2022-02-24: qty 10

## 2022-02-24 MED ORDER — PRENATAL MULTIVITAMIN CH
1.0000 | ORAL_TABLET | Freq: Every day | ORAL | Status: DC
  Administered 2022-02-25 – 2022-02-26 (×2): 1 via ORAL
  Filled 2022-02-24 (×2): qty 1

## 2022-02-24 MED ORDER — DEXAMETHASONE SODIUM PHOSPHATE 4 MG/ML IJ SOLN
INTRAMUSCULAR | Status: AC
  Filled 2022-02-24: qty 2

## 2022-02-24 MED ORDER — CEFAZOLIN SODIUM-DEXTROSE 2-4 GM/100ML-% IV SOLN
INTRAVENOUS | Status: AC
  Filled 2022-02-24: qty 100

## 2022-02-24 MED ORDER — OXYCODONE HCL 5 MG PO TABS
5.0000 mg | ORAL_TABLET | ORAL | Status: DC | PRN
  Filled 2022-02-24: qty 1

## 2022-02-24 MED ORDER — SENNOSIDES-DOCUSATE SODIUM 8.6-50 MG PO TABS
2.0000 | ORAL_TABLET | Freq: Every day | ORAL | Status: DC
  Administered 2022-02-25 – 2022-02-26 (×2): 2 via ORAL
  Filled 2022-02-24 (×2): qty 2

## 2022-02-24 MED ORDER — TRIAMCINOLONE ACETONIDE 40 MG/ML IJ SUSP
INTRAMUSCULAR | Status: AC
  Filled 2022-02-24: qty 1

## 2022-02-24 MED ORDER — ONDANSETRON HCL 4 MG/2ML IJ SOLN: 4.0000 mg | Freq: Three times a day (TID) | INTRAMUSCULAR | Status: DC | PRN

## 2022-02-24 MED ORDER — FENTANYL CITRATE (PF) 100 MCG/2ML IJ SOLN: 25.0000 ug | INTRAMUSCULAR | Status: DC | PRN

## 2022-02-24 MED ORDER — DEXAMETHASONE SODIUM PHOSPHATE 4 MG/ML IJ SOLN
INTRAMUSCULAR | Status: DC | PRN
  Administered 2022-02-24: 8 mg via INTRAVENOUS

## 2022-02-24 MED ORDER — DIPHENHYDRAMINE HCL 25 MG PO CAPS: 25.0000 mg | ORAL_CAPSULE | ORAL | Status: DC | PRN

## 2022-02-24 MED ORDER — SOD CITRATE-CITRIC ACID 500-334 MG/5ML PO SOLN
30.0000 mL | ORAL | Status: AC
  Administered 2022-02-24: 30 mL via ORAL

## 2022-02-24 MED ORDER — ACETAMINOPHEN 325 MG PO TABS: 325.0000 mg | ORAL_TABLET | ORAL | Status: DC | PRN

## 2022-02-24 MED ORDER — KETOROLAC TROMETHAMINE 30 MG/ML IJ SOLN
INTRAMUSCULAR | Status: AC
  Filled 2022-02-24: qty 1

## 2022-02-24 MED ORDER — DIPHENHYDRAMINE HCL 25 MG PO CAPS: 25.0000 mg | ORAL_CAPSULE | Freq: Four times a day (QID) | ORAL | Status: DC | PRN

## 2022-02-24 MED ORDER — COCONUT OIL OIL
1.0000 | TOPICAL_OIL | Status: DC | PRN
  Administered 2022-02-25 (×2): 1 via TOPICAL

## 2022-02-24 MED ORDER — ONDANSETRON HCL 4 MG/2ML IJ SOLN: 4.0000 mg | Freq: Once | INTRAMUSCULAR | Status: DC | PRN

## 2022-02-24 MED ORDER — SOD CITRATE-CITRIC ACID 500-334 MG/5ML PO SOLN
ORAL | Status: AC
  Filled 2022-02-24: qty 30

## 2022-02-24 MED ORDER — DIBUCAINE (PERIANAL) 1 % EX OINT: 1.0000 | TOPICAL_OINTMENT | CUTANEOUS | Status: DC | PRN

## 2022-02-24 MED ORDER — WITCH HAZEL-GLYCERIN EX PADS: 1.0000 | MEDICATED_PAD | CUTANEOUS | Status: DC | PRN

## 2022-02-24 MED ORDER — SIMETHICONE 80 MG PO CHEW
80.0000 mg | CHEWABLE_TABLET | Freq: Three times a day (TID) | ORAL | Status: DC
  Administered 2022-02-25 – 2022-02-26 (×5): 80 mg via ORAL
  Filled 2022-02-24 (×5): qty 1

## 2022-02-24 MED ORDER — LACTATED RINGERS IV SOLN: INTRAVENOUS | Status: DC | PRN

## 2022-02-24 MED ORDER — SCOPOLAMINE 1 MG/3DAYS TD PT72
1.0000 | MEDICATED_PATCH | Freq: Once | TRANSDERMAL | Status: DC
  Administered 2022-02-24: 1.5 mg via TRANSDERMAL

## 2022-02-24 MED ORDER — SODIUM CHLORIDE 0.9% FLUSH: 3.0000 mL | INTRAVENOUS | Status: DC | PRN

## 2022-02-24 MED ORDER — ENOXAPARIN SODIUM 40 MG/0.4ML IJ SOSY
40.0000 mg | PREFILLED_SYRINGE | INTRAMUSCULAR | Status: DC
  Administered 2022-02-25 – 2022-02-26 (×2): 40 mg via SUBCUTANEOUS
  Filled 2022-02-24 (×2): qty 0.4

## 2022-02-24 MED ORDER — MENTHOL 3 MG MT LOZG: 1.0000 | LOZENGE | OROMUCOSAL | Status: DC | PRN

## 2022-02-24 MED ORDER — TRIAMCINOLONE ACETONIDE 40 MG/ML IJ SUSP
INTRAMUSCULAR | Status: DC | PRN
  Administered 2022-02-24: 40 mg via INTRADERMAL

## 2022-02-24 MED ORDER — IBUPROFEN 600 MG PO TABS
600.0000 mg | ORAL_TABLET | Freq: Four times a day (QID) | ORAL | Status: DC
  Administered 2022-02-25 – 2022-02-26 (×5): 600 mg via ORAL
  Filled 2022-02-24 (×5): qty 1

## 2022-02-24 MED ORDER — ACETAMINOPHEN 160 MG/5ML PO SOLN: 325.0000 mg | ORAL | Status: DC | PRN

## 2022-02-24 MED ORDER — OXYTOCIN-SODIUM CHLORIDE 30-0.9 UT/500ML-% IV SOLN
2.5000 [IU]/h | INTRAVENOUS | Status: AC
  Administered 2022-02-24: 2.5 [IU]/h via INTRAVENOUS

## 2022-02-24 MED ORDER — OXYCODONE HCL 5 MG/5ML PO SOLN: 5.0000 mg | Freq: Once | ORAL | Status: DC | PRN

## 2022-02-24 MED ORDER — STERILE WATER FOR IRRIGATION IR SOLN
Status: DC | PRN
  Administered 2022-02-24: 1000 mL

## 2022-02-24 MED ORDER — ACETAMINOPHEN 10 MG/ML IV SOLN
INTRAVENOUS | Status: AC
  Filled 2022-02-24: qty 100

## 2022-02-24 MED ORDER — MORPHINE SULFATE (PF) 0.5 MG/ML IJ SOLN
INTRAMUSCULAR | Status: DC | PRN
  Administered 2022-02-24: 100 ug via INTRATHECAL

## 2022-02-24 MED ORDER — KETOROLAC TROMETHAMINE 30 MG/ML IJ SOLN
30.0000 mg | Freq: Four times a day (QID) | INTRAMUSCULAR | Status: AC | PRN
  Administered 2022-02-24: 30 mg via INTRAVENOUS

## 2022-02-24 MED ORDER — LIDOCAINE HCL (PF) 1 % IJ SOLN
INTRAMUSCULAR | Status: AC
  Filled 2022-02-24: qty 5

## 2022-02-24 MED ORDER — OXYCODONE HCL 5 MG PO TABS
5.0000 mg | ORAL_TABLET | Freq: Once | ORAL | Status: AC
  Administered 2022-02-24: 5 mg via ORAL

## 2022-02-24 MED ORDER — BUPIVACAINE IN DEXTROSE 0.75-8.25 % IT SOLN
INTRATHECAL | Status: DC | PRN
  Administered 2022-02-24: 1.6 mL via INTRATHECAL

## 2022-02-24 MED ORDER — OXYCODONE HCL 5 MG PO TABS: 5.0000 mg | ORAL_TABLET | Freq: Once | ORAL | Status: DC | PRN

## 2022-02-24 MED ORDER — CEFAZOLIN SODIUM-DEXTROSE 2-3 GM-%(50ML) IV SOLR: INTRAVENOUS | Status: DC | PRN

## 2022-02-24 MED ORDER — LIDOCAINE HCL (PF) 1 % IJ SOLN
INTRAMUSCULAR | Status: DC | PRN
  Administered 2022-02-24: 2 mL via INTRADERMAL

## 2022-02-24 MED ORDER — PHENYLEPHRINE HCL (PRESSORS) 10 MG/ML IV SOLN
INTRAVENOUS | Status: DC | PRN
  Administered 2022-02-24 (×2): 80 ug via INTRAVENOUS
  Administered 2022-02-24: 160 ug via INTRAVENOUS

## 2022-02-24 SURGICAL SUPPLY — 28 items
BENZOIN TINCTURE PRP APPL 2/3 (GAUZE/BANDAGES/DRESSINGS) ×1 IMPLANT
CHLORAPREP W/TINT 26ML (MISCELLANEOUS) ×2 IMPLANT
CLAMP UMBILICAL CORD (MISCELLANEOUS) ×1 IMPLANT
CLIP FILSHIE TUBAL LIGA STRL (Clip) IMPLANT
CLOTH BEACON ORANGE TIMEOUT ST (SAFETY) ×1 IMPLANT
DERMABOND ADVANCED .7 DNX12 (GAUZE/BANDAGES/DRESSINGS) IMPLANT
DRSG OPSITE POSTOP 4X10 (GAUZE/BANDAGES/DRESSINGS) ×1 IMPLANT
ELECT REM PT RETURN 9FT ADLT (ELECTROSURGICAL) ×1
ELECTRODE REM PT RTRN 9FT ADLT (ELECTROSURGICAL) ×1 IMPLANT
EXTRACTOR VACUUM KIWI (MISCELLANEOUS) IMPLANT
GLOVE BIOGEL PI IND STRL 7.0 (GLOVE) ×3 IMPLANT
GLOVE ECLIPSE 6.5 STRL STRAW (GLOVE) ×1 IMPLANT
GOWN STRL REUS W/ TWL LRG LVL3 (GOWN DISPOSABLE) ×2 IMPLANT
GOWN STRL REUS W/TWL LRG LVL3 (GOWN DISPOSABLE) ×2
NS IRRIG 1000ML POUR BTL (IV SOLUTION) ×1 IMPLANT
PAD OB MATERNITY 4.3X12.25 (PERSONAL CARE ITEMS) ×1 IMPLANT
PAD PREP 24X48 CUFFED NSTRL (MISCELLANEOUS) ×1 IMPLANT
RETRACTOR WND ALEXIS 25 LRG (MISCELLANEOUS) IMPLANT
RTRCTR WOUND ALEXIS 25CM LRG (MISCELLANEOUS)
STRIP CLOSURE SKIN 1/2X4 (GAUZE/BANDAGES/DRESSINGS) ×1 IMPLANT
SUT PLAIN 2 0 XLH (SUTURE) ×1 IMPLANT
SUT VIC AB 0 CT1 36 (SUTURE) ×2 IMPLANT
SUT VIC AB 2-0 CT1 27 (SUTURE) ×1
SUT VIC AB 2-0 CT1 TAPERPNT 27 (SUTURE) ×1 IMPLANT
SUT VIC AB 4-0 KS 27 (SUTURE) ×1 IMPLANT
TOWEL OR 17X24 6PK STRL BLUE (TOWEL DISPOSABLE) ×3 IMPLANT
TRAY FOLEY CATH SILVER 16FR (SET/KITS/TRAYS/PACK) ×1 IMPLANT
WATER STERILE IRR 1000ML POUR (IV SOLUTION) ×1 IMPLANT

## 2022-02-24 NOTE — Anesthesia Procedure Notes (Signed)
Spinal  Patient location during procedure: OR Start time: 02/24/2022 9:40 AM End time: 02/24/2022 9:46 AM Reason for block: surgical anesthesia Staffing Anesthesiologist: Janeece Riggers, MD Performed by: Janeece Riggers, MD Authorized by: Janeece Riggers, MD   Preanesthetic Checklist Completed: patient identified, IV checked, site marked, risks and benefits discussed, surgical consent, monitors and equipment checked, pre-op evaluation and timeout performed Spinal Block Patient position: sitting Prep: DuraPrep Patient monitoring: heart rate, cardiac monitor, continuous pulse ox and blood pressure Approach: midline Location: L4-5 Injection technique: single-shot Needle Needle type: Sprotte  Needle gauge: 24 G Needle length: 9 cm Assessment Sensory level: T4 Events: CSF return

## 2022-02-24 NOTE — Transfer of Care (Signed)
Immediate Anesthesia Transfer of Care Note  Patient: Brianna Chapman  Procedure(s) Performed: CESAREAN SECTION  Patient Location: PACU  Anesthesia Type:Spinal  Level of Consciousness: awake, alert  and oriented  Airway & Oxygen Therapy: Patient Spontanous Breathing  Post-op Assessment: Report given to RN and Post -op Vital signs reviewed and stable  Post vital signs: Reviewed and stable  Last Vitals:  Vitals Value Taken Time  BP 97/50 02/24/22 1119  Temp    Pulse 65 02/24/22 1130  Resp 18 02/24/22 1130  SpO2 100 % 02/24/22 1130  Vitals shown include unvalidated device data.  Last Pain:  Vitals:   02/24/22 0735  TempSrc: Oral  PainSc: 0-No pain         Complications: No notable events documented.

## 2022-02-24 NOTE — Anesthesia Postprocedure Evaluation (Signed)
Anesthesia Post Note  Patient: Brianna Chapman  Procedure(s) Performed: CESAREAN SECTION     Patient location during evaluation: PACU Anesthesia Type: Spinal Level of consciousness: oriented and awake and alert Pain management: pain level controlled Vital Signs Assessment: post-procedure vital signs reviewed and stable Respiratory status: spontaneous breathing, respiratory function stable and patient connected to nasal cannula oxygen Cardiovascular status: blood pressure returned to baseline and stable Postop Assessment: no headache, no backache and no apparent nausea or vomiting Anesthetic complications: no   No notable events documented.  Last Vitals:  Vitals:   02/24/22 1248 02/24/22 1345  BP: 100/68 (!) 88/63  Pulse: (!) 48 (!) 51  Resp:  15  Temp: (!) 36.1 C (!) 35.7 C  SpO2: 100% 98%    Last Pain:  Vitals:   02/24/22 1345  TempSrc: Axillary  PainSc: 4    Pain Goal:                Epidural/Spinal Function Cutaneous sensation: Able to Wiggle Toes (02/24/22 1345), Patient able to flex knees: Yes (02/24/22 1345), Patient able to lift hips off bed: Yes (02/24/22 1345), Back pain beyond tenderness at insertion site: No (02/24/22 1345), Progressively worsening motor and/or sensory loss: No (02/24/22 1345), Bowel and/or bladder incontinence post epidural: No (02/24/22 1345)  [name]

## 2022-02-24 NOTE — Op Note (Addendum)
Brianna Chapman PROCEDURE DATE: 02/24/2022  PREOPERATIVE DIAGNOSES: Intrauterine pregnancy at [redacted]w[redacted]d weeks gestation;  elective  POSTOPERATIVE DIAGNOSES: The same  PROCEDURE: Vacuum Assisted Low Transverse Cesarean Section  SURGEON:  Dr. Alvester Morin  ASSISTANT:  Dr. Salvadore Dom. An experienced assistant was required given the standard of surgical care given the complexity of the case.  This assistant was needed for exposure, dissection, suctioning, retraction, instrument exchange, assisting with delivery with administration of fundal pressure, and for overall help during the procedure.  ANESTHESIOLOGY TEAM: Anesthesiologist: Bethena Midget, MD CRNA: Caren Macadam, CRNA Student Nurse Anesthetist: Jola Schmidt, RN  INDICATIONS: Brianna Chapman is a 45 y.o. [phone] at [redacted]w[redacted]d here for cesarean section secondary to the indications listed under preoperative diagnoses; please see preoperative note for further details.  The risks of surgery were discussed with the patient including but were not limited to: bleeding which may require transfusion or reoperation; infection which may require antibiotics; injury to bowel, bladder, ureters or other surrounding organs; injury to the fetus; need for additional procedures including hysterectomy in the event of a life-threatening hemorrhage; formation of adhesions; placental abnormalities wth subsequent pregnancies; incisional problems; thromboembolic phenomenon and other postoperative/anesthesia complications.  The patient concurred with the proposed plan, giving informed written consent for the procedure.    FINDINGS:  Viable female infant in cephalic presentation.  Apgars 9 and 9.  Clear amniotic fluid.  Intact placenta, three vessel cord.  Normal uterus, fallopian tubes and ovaries bilaterally.  ANESTHESIA: Spinal INTRAVENOUS FLUIDS: 1000 ml   ESTIMATED BLOOD LOSS: 400 ml URINE OUTPUT:  100 ml SPECIMENS: Placenta sent to L&D COMPLICATIONS: None  immediate  PROCEDURE IN DETAIL:  The patient preoperatively received intravenous antibiotics and had sequential compression devices applied to her lower extremities.  She was then taken to the operating room where spinal anesthesia was administered and was found to be adequate. She was then placed in a dorsal supine position with a leftward tilt, and prepped and draped in a sterile manner.  A foley catheter was placed into her bladder and attached to constant gravity.  After an adequate timeout was performed, a Pfannenstiel skin incision was made with scalpel on her preexisting scar and carried through to the underlying layer of fascia. The fascia was incised in the midline, and this incision was extended bilaterally using the Mayo scissors.  Kocher clamps were applied to the superior aspect of the fascial incision and the underlying rectus muscles were dissected off bluntly and sharply.  A similar process was carried out on the inferior aspect of the fascial incision. The rectus muscles were separated in the midline and the peritoneum was entered bluntly. The Alexis self-retaining retractor was introduced into the abdominal cavity. A bladder flap was performed 2/2 bladder adhesion to anterior uterine wall. Attention was turned to the lower uterine segment where a low transverse hysterotomy was made with a scalpel 2 cm above bladder flap and extended bilaterally bluntly. Bandage scissors was used to extend left uterine incision.   The infant was successfully delivered via vacuum assistance with evidence of nuchal x2, the cord was clamped and cut after one minute, and the infant was handed over to the awaiting neonatology team. Uterine massage was then administered, and the placenta delivered intact with a three-vessel cord. The uterus was then cleared of clots and debris.  The hysterotomy was closed with 0 Vicryl in a running unlocked fashion and intermittent locking performed where increased bleeding found  along hysterotomy. Figure-of-eight 0 Vicryl serosal stitches were placed to  help with hemostasis.  The pelvis was cleared of all clot and debris. Hemostasis was confirmed on all surfaces.   Visualized previously existing right broad ligament defect. Left uterine wall adhesion visualized and  left intact.   The retractor was removed. The peritoneum was closed with a 0 Vicryl running stitch. The fascia was then closed using 0 Vicryl. The subcutaneous layer was irrigated, reapproximated with 2-0 plain gut interrupted stitches, and the skin was closed with a 4-0 Vicryl subcuticular stitch. The patient tolerated the procedure well. Sponge, instrument and needle counts were correct x 3. Kenalog injection (keloid prevention per patient request), surgical glue, and honeycomb for dressing dressing. She was taken to the recovery room in stable condition.   Gerlene Fee, DO OB Fellow, [facility] 02/24/2022, 11:34 AM

## 2022-02-24 NOTE — Progress Notes (Signed)
DR.Stinson present at bedside and received verbal order to discontinue the most recent 500cc bolus given new BP is 112/74

## 2022-02-24 NOTE — Discharge Summary (Signed)
Postpartum Discharge Summary    Patient Name: Brianna Chapman DOB: Nov 08, 1975 MRN: [id number]  Date of admission: 02/24/2022 Delivery date:02/24/2022  Delivering provider: Caren Macadam  Date of discharge: 02/26/2022  Admitting diagnosis: S/P repeat low transverse C-section [Z98.891] Intrauterine pregnancy: [redacted]w[redacted]d    Secondary diagnosis:  Principal Problem:   S/P repeat low transverse C-section Active Problems:   AMA (advanced maternal age) multigravida 40+   [place]   Gestational diabetes mellitus (GDM) in third trimester   Cesarean delivery with vacuum assistance, delivered, current hospitalization  Additional problems: None    Discharge diagnosis: Term Pregnancy Delivered, GDM A1, and Vacuum-assisted C-SECTION                                               Post partum procedures: none Augmentation: N/A Complications: None  Hospital course: Sceduled C/S   46y.o. yo [id number] admitted to the hospital 02/24/2022 for scheduled cesarean section with the following indication:Elective Repeat.Delivery details are as follows:  Membrane Rupture Time/Date: 10:10 AM ,02/24/2022   Delivery Method:C-Section, Vacuum Assisted  Details of operation can be found in separate operative note.  Patient had a postpartum course complicated.  She is ambulating, tolerating a regular diet, passing flatus, and urinating well. Patient is discharged home in stable condition on  02/26/22        Newborn Data: Birth date:02/24/2022  Birth time:10:12 AM  Gender:Female  Living status:Living  Apgars:9 ,9  Weight:3490 g     Magnesium Sulfate received: No BMZ received: No Rhophylac:No MMR:No T-DaP:Given prenatally Flu: No Transfusion:No  Physical exam  Vitals:   02/24/22 2337 02/25/22 1430 02/25/22 2120 02/26/22 0524  BP: 98/63 106/64 (!) 104/57 123/71  Pulse: (!) 56 (!) 57 (!) 57 (!) 51  Resp: _0 Temp: 98.7 F (37.1 C) 98.4 F (36.9 C) 98.7 F  (37.1 C) 98.5 F (36.9 C)  TempSrc: Oral Oral Oral Oral  SpO2: 98%     Weight:      Height:       General: alert, cooperative, and no distress Lochia: appropriate Uterine Fundus: firm Incision: Healing well with no significant drainage, No significant erythema, Dressing is clean, dry, and intact DVT Evaluation: No evidence of DVT seen on physical exam. Labs: Lab Results  Component Value Date   WBC 15.1 (H) 02/25/2022   HGB 8.7 (L) 02/25/2022   HCT 25.0 (L) 02/25/2022   MCV 80.1 02/25/2022   PLT 136 (L) 02/25/2022      Latest Ref Rng & Units 03/05/2020   11:10 AM  CMP  Glucose 65 - 99 mg/dL 71   BUN 6 - 24 mg/dL 13   Creatinine 0.57 - 1.00 mg/dL 0.78   Sodium 134 - 144 mmol/L 139   Potassium 3.5 - 5.2 mmol/L 4.4   Chloride 96 - 106 mmol/L 104   CO2 20 - 29 mmol/L 22   Calcium 8.7 - 10.2 mg/dL 8.9   Total Protein 6.0 - 8.5 g/dL 6.2   Total Bilirubin 0.0 - 1.2 mg/dL 0.4   Alkaline Phos 44 - 121 IU/L 86   AST 0 - 40 IU/L 23   ALT 0 - 32 IU/L 32    Edinburgh Score:    02/24/2022   12:48 PM  Edinburgh Postnatal Depression Scale Screening Tool  I  have been able to laugh and see the funny side of things. 0  I have looked forward with enjoyment to things. 0  I have blamed myself unnecessarily when things went wrong. 0  I have been anxious or worried for no good reason. 0  I have felt scared or panicky for no good reason. 0  Things have been getting on top of me. 0  I have been so unhappy that I have had difficulty sleeping. 0  I have felt sad or miserable. 0  I have been so unhappy that I have been crying. 0  The thought of harming myself has occurred to me. 0  Edinburgh Postnatal Depression Scale Total 0     After visit meds:  Allergies as of 02/26/2022   No Known Allergies      Medication List     STOP taking these medications    Accu-Chek Guide Me w/Device Kit   Accu-Chek Guide test strip Generic drug: glucose blood   Accu-Chek Softclix Lancets  lancets   aspirin EC 81 MG tablet       TAKE these medications    acetaminophen 500 MG tablet Commonly known as: TYLENOL Take 2 tablets (1,000 mg total) by mouth every 6 (six) hours.   ibuprofen 600 MG tablet Commonly known as: ADVIL Take 1 tablet (600 mg total) by mouth every 6 (six) hours.   multivitamin-prenatal 27-0.8 MG Tabs tablet Take 1 tablet by mouth daily.   oxyCODONE 5 MG immediate release tablet Commonly known as: Oxy IR/ROXICODONE Take 1-2 tablets (5-10 mg total) by mouth every 4 (four) hours as needed for moderate pain.               Discharge Care Instructions  (From admission, onward)           Start     Ordered   02/26/22 0000  Discharge wound care:       Comments: In the shower, let soapy water drip on the wound (don't scrub). Pat it dry. If your skin folds over the incision, put a cloth pad on it to keep it from getting sweaty. Within a week, your wound will be mostly healed. But look out for issues: If you develop a fever or if the skin surrounding the incision turns red, it starts oozing green or pus-colored liquid, or it becomes hard or painful, call your doctor. These could be signs of infection.  You can remove the dressing completely in 7-10 days after your C-section. You may want to put another bandage on to protect your clothes   02/26/22 1152             Discharge home in stable condition Infant Feeding: Breast Infant Disposition:home with mother Discharge instruction: per After Visit Summary and Postpartum booklet. Activity: Advance as tolerated. Pelvic rest for 6 weeks.  Diet: routine diet Future Appointments: Future Appointments  Date Time Provider [place]  03/04/2022 10:00 AM [facility] None  04/14/2022  9:15 AM [facility] None  04/14/2022 10:55 AM Chancy Milroy, MD [facility] None   Follow up Visit:  Message sent to [facility] by Autry-Lott on 02/26/2022  Please schedule this patient for a  In person postpartum visit in 6 weeks with the following provider: Any provider. Additional Postpartum F/U:Incision check 1 week  High risk pregnancy complicated by: GDM and AMA Delivery mode:  C-Section, Vacuum Assisted  Anticipated Birth Control:   husband planning vasectomy   02/26/2022 Caren Macadam, MD

## 2022-02-24 NOTE — Progress Notes (Signed)
Patient ID: Brianna Chapman, female   DOB: [birth year], 46 y.o.   MRN: [id number]  Called by nurse regarding patient. Had c/s earlier today. Patient having lower BPs - 76H-20N systolically. Body temp is 96-97.   Patient seen - doing well. Mild abdominal pain. No dizziness or lightheadedness. Has lower BP at baseline.  BP (!) 88/63 (BP Location: Right Arm)   Pulse (!) 51   Temp (!) 96.2 F (35.7 C) (Axillary)   Resp 15   Ht 5\' 4"  (1.626 m)   Wt 76.2 kg   LMP 05/26/2021 (Approximate)   SpO2 98%   Breastfeeding Yes   BMI 28.84 kg/m  Gen: A&Ox3, NAD Lungs: nonlabored breathing Abd: soft, mild lower abdominal tenderness. No abdominal distension. Fundus firm. No edema.  Will give warm IVF. No concern for internal bleeding at this point. I discussed pt with Dr Eligha Bridegroom - will get bear hugger for patient to warm.  Truett Mainland, DO 02/24/2022 2:23 PM

## 2022-02-25 LAB — BIRTH TISSUE RECOVERY COLLECTION (PLACENTA DONATION)

## 2022-02-25 LAB — GLUCOSE, CAPILLARY: Glucose-Capillary: 96 mg/dL (ref 70–99)

## 2022-02-25 LAB — CBC
HCT: 25 % — ABNORMAL LOW (ref 36.0–46.0)
Hemoglobin: 8.7 g/dL — ABNORMAL LOW (ref 12.0–15.0)
MCH: 27.9 pg (ref 26.0–34.0)
MCHC: 34.8 g/dL (ref 30.0–36.0)
MCV: 80.1 fL (ref 80.0–100.0)
Platelets: 136 10*3/uL — ABNORMAL LOW (ref 150–400)
RBC: 3.12 MIL/uL — ABNORMAL LOW (ref 3.87–5.11)
RDW: 15 % (ref 11.5–15.5)
WBC: 15.1 10*3/uL — ABNORMAL HIGH (ref 4.0–10.5)
nRBC: 0 % (ref 0.0–0.2)

## 2022-02-25 MED ORDER — FERROUS FUMARATE 324 (106 FE) MG PO TABS
1.0000 | ORAL_TABLET | ORAL | Status: DC
  Administered 2022-02-25: 106 mg via ORAL
  Filled 2022-02-25: qty 1

## 2022-02-25 NOTE — Progress Notes (Addendum)
Post Partum Day 1 Subjective: no complaints, up ad lib, voiding, tolerating PO, and + flatus  Objective: Blood pressure 98/63, pulse (!) 56, temperature 98.7 F (37.1 C), temperature source Oral, resp. rate 15, height 5\' 4"  (1.626 m), weight 76.2 kg, last menstrual period 05/26/2021, SpO2 98 %, currently breastfeeding.  Physical Exam:  General: alert, cooperative, and no distress Lochia: appropriate Uterine Fundus: firm Incision: healing well DVT Evaluation: No evidence of DVT seen on physical exam.  Recent Labs    02/22/22 1015  HGB 10.6*  HCT 31.7*    Assessment/Plan: Plan for discharge tomorrow and Breastfeeding   LOS: 1 day   Hollace Hayward, Student-MidWife 02/25/2022, 6:40 AM

## 2022-02-26 MED ORDER — IBUPROFEN 600 MG PO TABS: 600.0000 mg | ORAL_TABLET | Freq: Four times a day (QID) | ORAL | 0 refills | Status: DC

## 2022-02-26 MED ORDER — OXYCODONE HCL 5 MG PO TABS: 5.0000 mg | ORAL_TABLET | ORAL | 0 refills | Status: DC | PRN

## 2022-02-26 MED ORDER — ACETAMINOPHEN 500 MG PO TABS: 1000.0000 mg | ORAL_TABLET | Freq: Four times a day (QID) | ORAL | 0 refills | Status: DC

## 2022-02-26 NOTE — Lactation Note (Signed)
This note was copied from a baby's chart. Lactation Consultation Note  Patient Name: Girl Aiman Sonn [id number]'[id number] Date: 02/26/2022 Reason for consult: Follow-up assessment Age:44 hours  P3, Observed baby latch with ease. Intermittent swallows heard. Guided baby deeper on breast. Reviewed engorgement care and monitoring voids/stools. Discussed if baby becomes sleepy at home to pump and supplement baby with mother's milk.  Maternal Data Has patient been taught Hand Expression?: Yes Does the patient have breastfeeding experience prior to this delivery?: Yes How long did the patient breastfeed?: one year  Feeding Mother's Current Feeding Choice: Breast Milk  LATCH Score Latch: Grasps breast easily, tongue down, lips flanged, rhythmical sucking.  Audible Swallowing: A few with stimulation  Type of Nipple: Everted at rest and after stimulation  Comfort (Breast/Nipple): Soft / non-tender  Hold (Positioning): No assistance needed to correctly position infant at breast.  LATCH Score: 9   Lactation Tools Discussed/Used  DEBP  Interventions Interventions: Skin to skin;Education  Discharge Discharge Education: Engorgement and breast care;Warning signs for feeding baby  Consult Status Consult Status: Complete Date: 02/26/22 Follow-up type: In-patient    Vivianne Master [facility] 02/26/2022, 1:30 PM

## 2022-02-26 NOTE — Lactation Note (Signed)
This note was copied from a baby's chart. Lactation Consultation Note  Patient Name: Brianna Chapman Date: 02/26/2022 Reason for consult: Initial assessment Age:44 hours  P3, 9.17% weight loss.  1.75% weight loss in the last 24 hours.  Weight is stabilizing. 6 voids/7 stools in the last 24 hours. Baby sleeping.  Suggest calling for LC to view next feeding.  Maternal Data Has patient been taught Hand Expression?: Yes Does the patient have breastfeeding experience prior to this delivery?: Yes How long did the patient breastfeed?: one year  Feeding Mother's Current Feeding Choice: Breast Milk  Interventions Interventions: Education  Consult Status Consult Status: Follow-up Date: 02/26/22 Follow-up type: In-patient    Vivianne Master [facility] 02/26/2022, 11:52 AM

## 2022-03-02 ENCOUNTER — Other Ambulatory Visit: Payer: Self-pay

## 2022-03-02 ENCOUNTER — Inpatient Hospital Stay (HOSPITAL_COMMUNITY)
Admission: AD | Admit: 2022-03-02 | Discharge: 2022-03-02 | Disposition: A | Discharge status: Home / Self Care | Attending: Obstetrics & Gynecology | Admitting: Obstetrics & Gynecology

## 2022-03-02 ENCOUNTER — Telehealth: Payer: Self-pay

## 2022-03-02 ENCOUNTER — Encounter: Admitting: Obstetrics and Gynecology

## 2022-03-02 ENCOUNTER — Encounter (HOSPITAL_COMMUNITY): Payer: Self-pay | Admitting: Obstetrics & Gynecology

## 2022-03-02 DIAGNOSIS — Z98891 History of uterine scar from previous surgery: Secondary | ICD-10-CM | POA: Diagnosis not present

## 2022-03-02 DIAGNOSIS — O9089 Other complications of the puerperium, not elsewhere classified: Secondary | ICD-10-CM | POA: Insufficient documentation

## 2022-03-02 DIAGNOSIS — R519 Headache, unspecified: Secondary | ICD-10-CM | POA: Diagnosis not present

## 2022-03-02 DIAGNOSIS — R110 Nausea: Secondary | ICD-10-CM | POA: Insufficient documentation

## 2022-03-02 LAB — COMPREHENSIVE METABOLIC PANEL
ALT: 40 U/L (ref 0–44)
AST: 34 U/L (ref 15–41)
Albumin: 3.1 g/dL — ABNORMAL LOW (ref 3.5–5.0)
Alkaline Phosphatase: 96 U/L (ref 38–126)
Anion gap: 11 (ref 5–15)
BUN: 11 mg/dL (ref 6–20)
CO2: 24 mmol/L (ref 22–32)
Calcium: 8.6 mg/dL — ABNORMAL LOW (ref 8.9–10.3)
Chloride: 101 mmol/L (ref 98–111)
Creatinine, Ser: 0.72 mg/dL (ref 0.44–1.00)
GFR, Estimated: >60 mL/min (ref >60)
Glucose, Bld: 83 mg/dL (ref 70–99)
Potassium: 3.4 mmol/L — ABNORMAL LOW (ref 3.5–5.1)
Sodium: 136 mmol/L (ref 135–145)
Total Bilirubin: 0.7 mg/dL (ref 0.3–1.2)
Total Protein: 6.7 g/dL (ref 6.5–8.1)

## 2022-03-02 LAB — CBC
HCT: 32 % — ABNORMAL LOW (ref 36.0–46.0)
Hemoglobin: 11 g/dL — ABNORMAL LOW (ref 12.0–15.0)
MCH: 27.3 pg (ref 26.0–34.0)
MCHC: 34.4 g/dL (ref 30.0–36.0)
MCV: 79.4 fL — ABNORMAL LOW (ref 80.0–100.0)
Platelets: 207 10*3/uL (ref 150–400)
RBC: 4.03 MIL/uL (ref 3.87–5.11)
RDW: 15.7 % — ABNORMAL HIGH (ref 11.5–15.5)
WBC: 10.8 10*3/uL — ABNORMAL HIGH (ref 4.0–10.5)
nRBC: 0 % (ref 0.0–0.2)

## 2022-03-02 MED ORDER — ACETAMINOPHEN-CAFFEINE 500-65 MG PO TABS
2.0000 | ORAL_TABLET | Freq: Once | ORAL | Status: AC
  Administered 2022-03-02: 2 via ORAL
  Filled 2022-03-02: qty 2

## 2022-03-02 NOTE — MAU Note (Signed)
Brianna Chapman is a 46 y.o. at Unknown here in MAU reporting: she has a H/A, nausea, and increased BP that she's been taking @ home.  Reports took "Oxy" for HA this morning @ 0600.  States took Nifedipine 30 mg @ 0300.  Rx is expired and from her previous pregnancy.  States "I didn't want to go the the Emergency Room".  S/P Cesarean on 02/24/22 LMP: N/A Onset of complaint: yesterday Pain score: 7 Vitals:   03/02/22 0945  BP: 115/86  Pulse: 75  Resp: 18  Temp: 97.8 F (36.6 C)  SpO2: 100%     FHT:N/A Lab orders placed from triage:   None

## 2022-03-02 NOTE — MAU Provider Note (Signed)
History     CSN: [id number]  Arrival date and time: 03/02/22 5361  Event Date/Time  First Provider Initiated Contact with Patient 03/02/22 1002     Chief Complaint  Patient presents with   Headache   Nausea   HPI Brianna Chapman is a 46 y.o. [id number] s/p repeat cesarean section on 02/24/2022. She presents to MAU with chief complaint of headache. Onset yesterday. Pain score was 9.5/10 overnight but is 8/10 on arrival to MAU. She attempted management with her prescribed Oxycodone and her pain briefly went from 9.5 to 6/10. Patient subsequently took her blood pressure, obtained a reading of 150s/90s and took a Procardia XL 30 mg she had leftover from her 02/2020 pregnancy.  Her most recent pregnancy was not complicated by blood pressure elevations of any kind. She denies visual disturbances, RUQ/epigastric pain, new onset swelling or weight gain.  As of 1015 patient has not yet eaten. She states she has pervasive nausea and can only tolerate sips of liquids. She denies vomiting.   Patient is exclusively breastfeeding.  She denies concerns related to breastfeeding.  Patient receives care with [facility] and is scheduled for an incision check 03/04/2022.  OB History     Gravida  4   Para  3   Term  3   Preterm      AB  1   Living  3      SAB  1   IAB      Ectopic      Multiple  0   Live Births  3           Past Medical History:  Diagnosis Date   Keloid    on c-section scar   Medical history non-contributory    Postpartum hypertension     Past Surgical History:  Procedure Laterality Date   CESAREAN SECTION  12/05/2008   CESAREAN SECTION N/A 02/25/2020   Procedure: CESAREAN SECTION;  Surgeon: Gwynne Edinger, MD;  Location: [facility] LD ORS;  Service: Obstetrics;  Laterality: N/A;   CESAREAN SECTION N/A 02/24/2022   Procedure: CESAREAN SECTION;  Surgeon: Caren Macadam, MD;  Location: [facility] LD ORS;  Service: Obstetrics;  Laterality: N/A;    Family  History  Problem Relation Age of Onset   Asthma Neg Hx    Cancer Neg Hx    Diabetes Neg Hx    Heart disease Neg Hx    Hypertension Neg Hx    Stroke Neg Hx     Social History   Tobacco Use   Smoking status: Never   Smokeless tobacco: Never  Vaping Use   Vaping Use: Never used  Substance Use Topics   Alcohol use: Not Currently   Drug use: Not Currently    Allergies: No Known Allergies  Medications Prior to Admission  Medication Sig Dispense Refill Last Dose   ibuprofen (ADVIL) 600 MG tablet Take 1 tablet (600 mg total) by mouth every 6 (six) hours. 30 tablet 0 03/02/2022 at 0400   NIFEdipine (ADALAT CC) 30 MG 24 hr tablet Take 30 mg by mouth daily.    at 0300   oxyCODONE (OXY IR/ROXICODONE) 5 MG immediate release tablet Take 1-2 tablets (5-10 mg total) by mouth every 4 (four) hours as needed for moderate pain. 20 tablet 0 03/02/2022 at 0600   acetaminophen (TYLENOL) 500 MG tablet Take 2 tablets (1,000 mg total) by mouth every 6 (six) hours. 30 tablet 0    Prenatal Vit-Fe Fumarate-FA (MULTIVITAMIN-PRENATAL) 27-0.8 MG TABS  tablet Take 1 tablet by mouth daily.       Review of Systems  Neurological:  Positive for headaches.  All other systems reviewed and are negative.  Physical Exam   Blood pressure 115/86, pulse 75, temperature 97.8 F (36.6 C), temperature source Oral, resp. rate 18, weight 69 kg, SpO2 100 %, currently breastfeeding.  Physical Exam Vitals and nursing note reviewed.  Constitutional:      General: She is not in acute distress.    Appearance: She is well-developed. She is not ill-appearing.  Cardiovascular:     Rate and Rhythm: Normal rate.     Heart sounds: Normal heart sounds.  Pulmonary:     Effort: Pulmonary effort is normal.  Abdominal:     Palpations: Abdomen is soft.  Skin:    Capillary Refill: Capillary refill takes less than 2 seconds.  Neurological:     Mental Status: She is alert and oriented to person, place, and time.  Psychiatric:         Mood and Affect: Mood normal.        Speech: Speech normal.        Behavior: Behavior normal.     MAU Course  Procedures  MDM  --EMR reviewed. Patient without GHTN or CHTN in most recent pregnancy. --Underscored to patient she may have headache related to low PO intake, ongoing rebound headache as possible side effect from taking expired Procardia. Strongly encouraged patient to discard Procardia  Patient Vitals for the past 24 hrs:  BP Temp Temp src Pulse Resp SpO2 Weight  03/02/22 1100 123/77 -- -- (!) 56 -- -- --  03/02/22 1045 115/81 -- -- (!) 57 -- -- --  03/02/22 1030 123/78 -- -- (!) 57 -- -- --  03/02/22 1015 126/89 -- -- 63 -- -- --  03/02/22 1014 119/79 -- -- 63 -- -- --  03/02/22 0945 115/86 97.8 F (36.6 C) Oral 75 18 100 % --  03/02/22 0938 -- -- -- -- -- -- 69 kg   Orders Placed This Encounter  Procedures   CBC   Comprehensive metabolic panel   Measure blood pressure   Discharge patient   Results for orders placed or performed during the hospital encounter of 03/02/22 (from the past 24 hour(s))  CBC     Status: Abnormal   Collection Time: 03/02/22 10:11 AM  Result Value Ref Range   WBC 10.8 (H) 4.0 - 10.5 K/uL   RBC 4.03 3.87 - 5.11 MIL/uL   Hemoglobin 11.0 (L) 12.0 - 15.0 g/dL   HCT 24.0 (L) 97.3 - 53.2 %   MCV 79.4 (L) 80.0 - 100.0 fL   MCH 27.3 26.0 - 34.0 pg   MCHC 34.4 30.0 - 36.0 g/dL   RDW 99.2 (H) 42.6 - 83.4 %   Platelets 207 150 - 400 K/uL   nRBC 0.0 0.0 - 0.2 %  Comprehensive metabolic panel     Status: Abnormal   Collection Time: 03/02/22 10:11 AM  Result Value Ref Range   Sodium 136 135 - 145 mmol/L   Potassium 3.4 (L) 3.5 - 5.1 mmol/L   Chloride 101 98 - 111 mmol/L   CO2 24 22 - 32 mmol/L   Glucose, Bld 83 70 - 99 mg/dL   BUN 11 6 - 20 mg/dL   Creatinine, Ser 1.96 0.44 - 1.00 mg/dL   Calcium 8.6 (L) 8.9 - 10.3 mg/dL   Total Protein 6.7 6.5 - 8.1 g/dL   Albumin 3.1 (L) 3.5 -  5.0 g/dL   AST 34 15 - 41 U/L   ALT 40 0 - 44 U/L    Alkaline Phosphatase 96 38 - 126 U/L   Total Bilirubin 0.7 0.3 - 1.2 mg/dL   GFR, Estimated >61 >44 mL/min   Anion gap 11 5 - 15   Meds ordered this encounter  Medications   acetaminophen-caffeine (EXCEDRIN TENSION HEADACHE) 500-65 MG per tablet 2 tablet   Assessment and Plan  --46 y.o. [id number] s/p repeat cesarean 02/24/2022 --Normal blood pressure during MAU encounter --PEC labs WNL --Headache improved to 3/10 with PO Excedrin Tension --Discharge home in stable condition  Calvert Cantor, MSA, MSN, CNM 03/02/2022, 12:56 PM

## 2022-03-04 ENCOUNTER — Ambulatory Visit (INDEPENDENT_AMBULATORY_CARE_PROVIDER_SITE_OTHER): Admitting: General Practice

## 2022-03-04 DIAGNOSIS — Z4889 Encounter for other specified surgical aftercare: Secondary | ICD-10-CM

## 2022-03-04 NOTE — Progress Notes (Signed)
The wound is cleansed, debrided of foreign material as much as possible, and dressed.   Dressing removed in office. Incision had dried blood but is clean, dry and intact. Pt has history of keloids and keloids were noticed at the incision site.   The patient is alerted to watch for any signs of infection (redness, pus, pain, increased swelling or fever) and call if such occurs. Pt verbalized understanding.   Home wound care instructions are provided. Tetanus vaccination status reviewed: tetanus status unknown to the patient.

## 2022-03-05 ENCOUNTER — Other Ambulatory Visit: Payer: Self-pay | Admitting: Obstetrics and Gynecology

## 2022-03-23 NOTE — Telephone Encounter (Signed)
CALLED TO INFORM PT HAD BLURRED VISION A HEADACHE AND WAS INFORMED TO HEAD MAU

## 2022-04-14 ENCOUNTER — Ambulatory Visit (INDEPENDENT_AMBULATORY_CARE_PROVIDER_SITE_OTHER): Admitting: Obstetrics and Gynecology

## 2022-04-14 ENCOUNTER — Encounter: Payer: Self-pay | Admitting: Obstetrics and Gynecology

## 2022-04-14 ENCOUNTER — Other Ambulatory Visit

## 2022-04-14 DIAGNOSIS — O24410 Gestational diabetes mellitus in pregnancy, diet controlled: Secondary | ICD-10-CM | POA: Diagnosis not present

## 2022-04-14 NOTE — Patient Instructions (Signed)

## 2022-04-14 NOTE — Progress Notes (Signed)
    Post Partum Visit Note  Brianna Chapman is a 46 y.o. [phone] female who presents for a postpartum visit. She is 7 weeks postpartum following a repeat cesarean section.  I have fully reviewed the prenatal and intrapartum course. The delivery was at 39 gestational weeks.  Anesthesia: spinal. Postpartum course has been unremarkable. Baby is doing well. Baby is feeding by breast. Bleeding staining only. Bowel function is normal. Bladder function is normal. Patient is sexually active. Contraception method is condoms. Postpartum depression screening: negative.   The pregnancy intention screening data noted above was reviewed. Potential methods of contraception were discussed. The patient elected to proceed with No data recorded.    Health Maintenance Due  Topic Date Due   COVID-19 Vaccine (1) Never done   INFLUENZA VACCINE  Never done    The following portions of the patient's history were reviewed and updated as appropriate: allergies, current medications, past family history, past medical history, past social history, past surgical history, and problem list.  Review of Systems Pertinent items noted in HPI and remainder of comprehensive ROS otherwise negative.  Objective:  There were no vitals taken for this visit.   General:  alert   Breasts:  not indicated  Lungs: clear to auscultation bilaterally  Heart:  regular rate and rhythm, S1, S2 normal, no murmur, click, rub or gallop  Abdomen: soft, non-tender; bowel sounds normal; no masses,  no organomegaly   Wound well approximated incision  GU exam:  not indicated       Assessment:    There are no diagnoses linked to this encounter.  Nl postpartum exam.  H/O Gestational DM  Plan:   Essential components of care per ACOG recommendations:  1.  Mood and well being: Patient with negative depression screening today. Reviewed local resources for support.  - Patient tobacco use? No.   - hx of drug use? No.    2. Infant care and  feeding:  -Patient currently breastmilk feeding? Yes -Social determinants of health (SDOH) reviewed in EPIC. No concerns  3. Sexuality, contraception and birth spacing - Patient does not want a pregnancy in the next year.  Desired family size is completed   - Reviewed reproductive life planning. Reviewed contraceptive methods based on pt preferences and effectiveness.  Husband is scheduled for vasectomy. Condoms for now - Discussed birth spacing of 18 months  4. Sleep and fatigue -Encouraged family/partner/community support of 4 hrs of uninterrupted sleep to help with mood and fatigue  5. Physical Recovery  - Discussed patients delivery and complications. She describes her labor as good. - Patient had a C-section repeat; no problems after deliver. - Patient has urinary incontinence? No. - Patient is safe to resume physical and sexual activity  6.  Health Maintenance - HM due items addressed Yes - Last pap smear in [place] within the last 3 yrs per pt. Pt declined pap smear today  7. Chronic Disease/Pregnancy Condition follow up: None  Will schedule Glucola due to H/O GDM  Nettie Elm, MD [facility] for [facility], [facility]

## 2022-04-15 ENCOUNTER — Other Ambulatory Visit

## 2022-04-15 DIAGNOSIS — O24410 Gestational diabetes mellitus in pregnancy, diet controlled: Secondary | ICD-10-CM

## 2022-04-16 LAB — GLUCOSE TOLERANCE, 2 HOURS
Glucose, 2 hour: 109 mg/dL (ref 70–139)
Glucose, GTT - Fasting: 85 mg/dL (ref 70–99)

## 2023-10-23 IMAGING — US US MFM OB DETAIL+14 WK
1 series · 12 of 28 positions shown · non-contrast
Comparison: none

[Series 1: us mfm ob detail+14 wk · 12 of 85 slices shown]
[im 4/85]
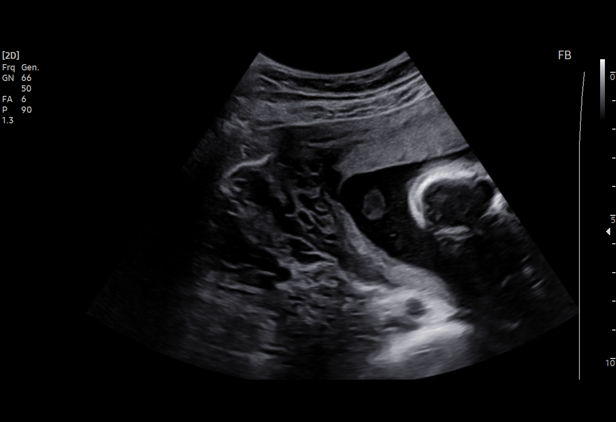
[im 10/85]
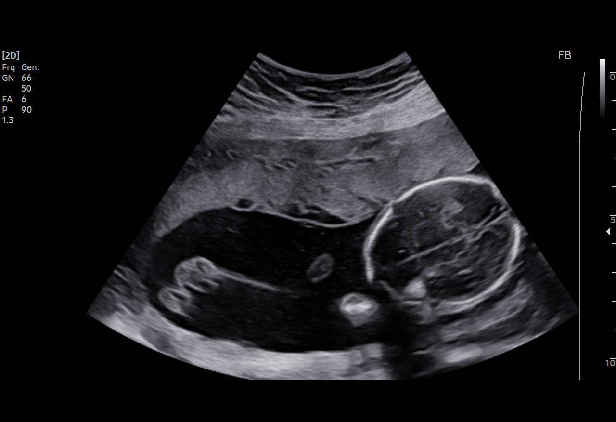
[im 16/85]
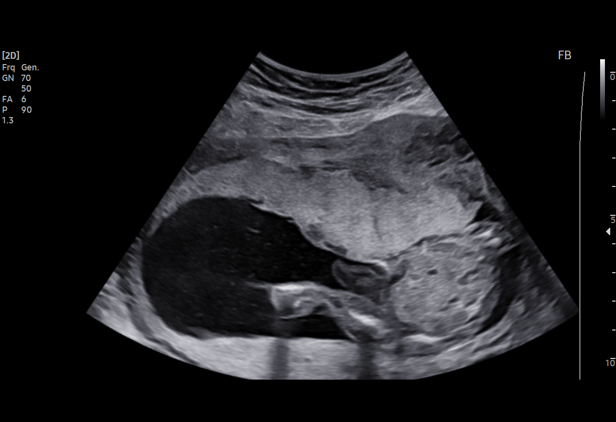
[im 25/85]
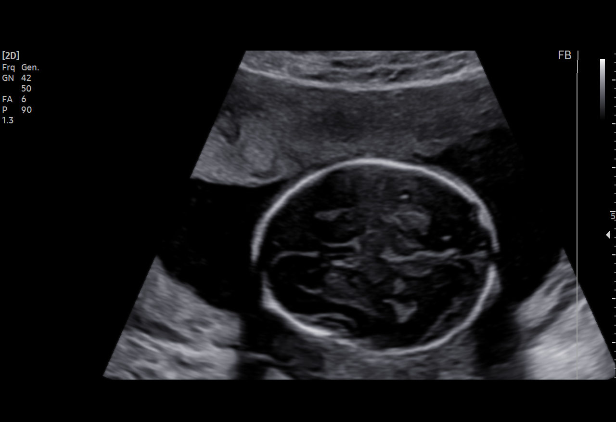
[im 32/85]
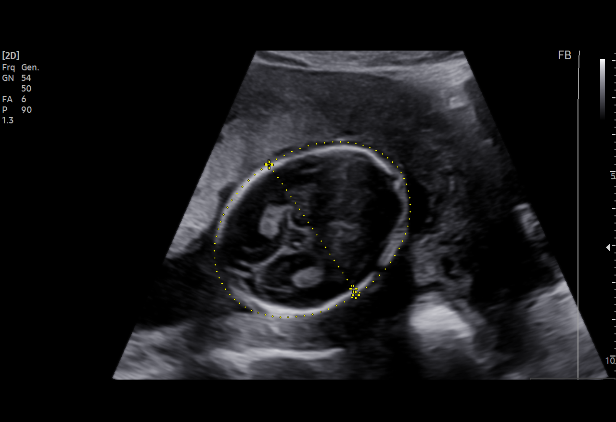
[im 38/85]
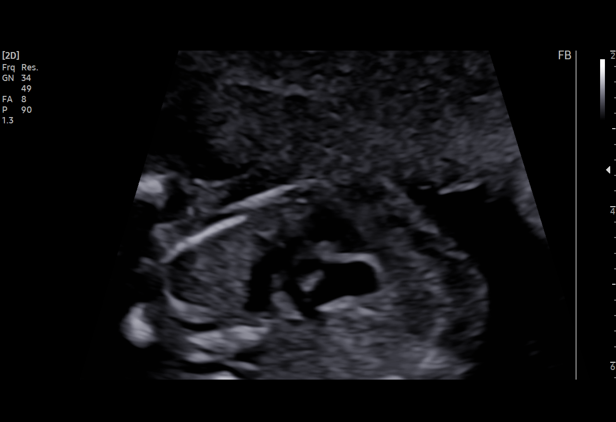
[im 47/85]
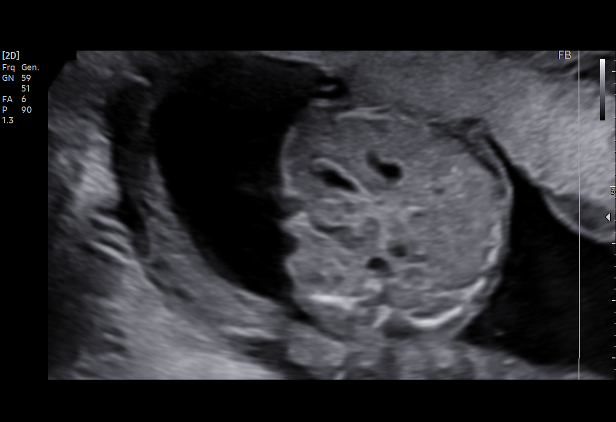
[im 53/85]
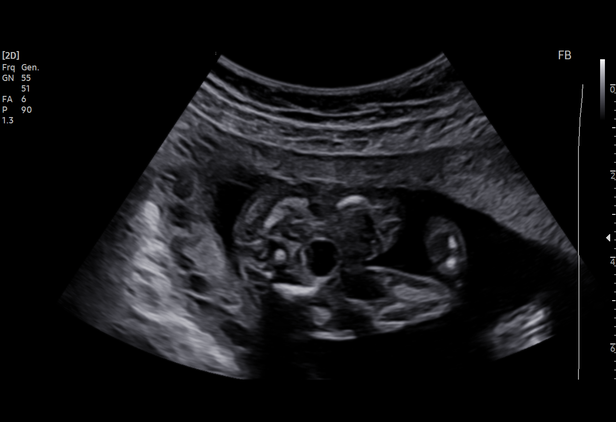
[im 60/85]
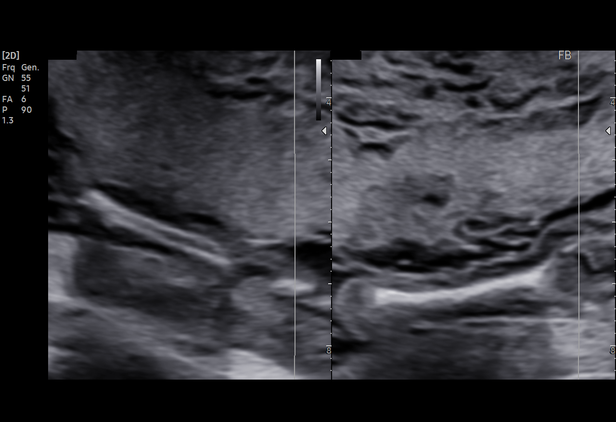
[im 69/85]
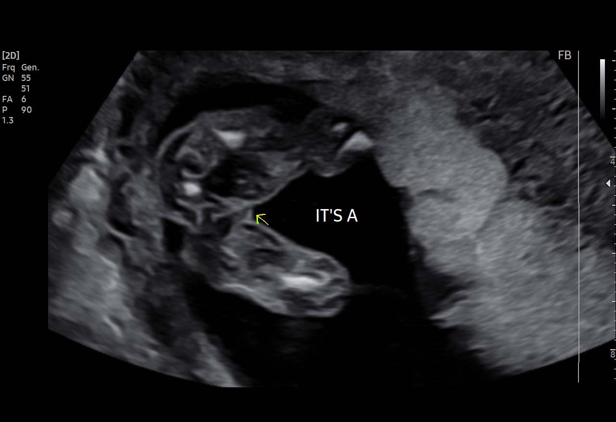
[im 75/85]
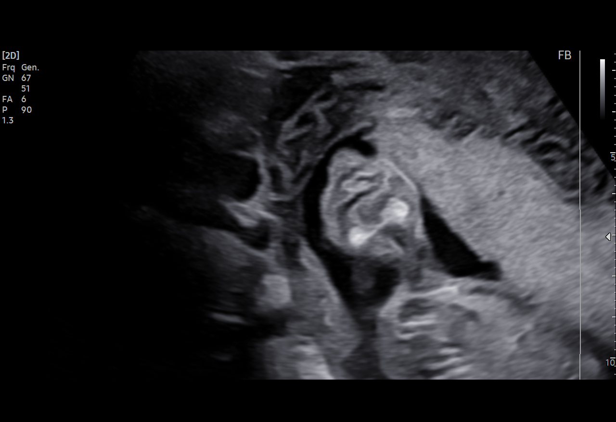
[im 81/85]
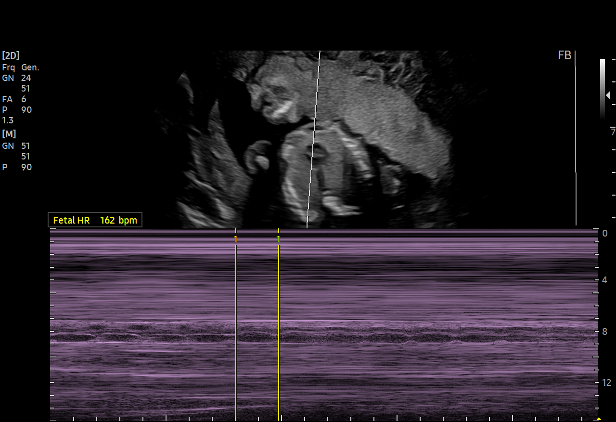

[12 of 28 positions shown; findings below may reference images not displayed]

Addendum:\.br----------------------------------------------------------------------
----------------------------------------------------------------------

----------------------------------------------------------------------

----------------------------------------------------------------------

----------------------------------------------------------------------

----------------------------------------------------------------------
Indications

 Advanced maternal age multigravida 35+,
 second trimester (37y.o)
 Encounter for antenatal screening for
 malformations
 Previous cesarean delivery, antepartum (x2)
 Genetic carrier (sickle cell)
 19 weeks gestation of pregnancy
 LR NIPS/Neg AFP
----------------------------------------------------------------------
Fetal Evaluation

 Num Of Fetuses:         1
 Fetal Heart Rate(bpm):  162
 Cardiac Activity:       Observed
 Presentation:           Cephalic
 Placenta:               Anterior
 P. Cord Insertion:      Previously Visualized

 Amniotic Fluid
 AFI FV:      Within normal limits

                             Largest Pocket(cm)

----------------------------------------------------------------------
Biometry
 BPD:        42  mm     G. Age:  18w 5d         26  %    CI:         70.1   %    70 - 86
                                                         FL/HC:      17.1   %    16.1 -
 HC:       160   mm     G. Age:  18w 6d         22  %    HC/AC:      1.16        1.09 -
 AC:      138.3  mm     G. Age:  19w 2d         44  %    FL/BPD:     65.2   %
 FL:       27.4  mm     G. Age:  18w 3d         14  %    FL/AC:      19.8   %    20 - 24
 HUM:        27  mm     G. Age:  18w 4d         32  %
 CER:      17.9  mm     G. Age:  17w 6d        4.5  %
 NFT:       4.0  mm

 LV:        6.4  mm
 CM:        3.7  mm

 Est. FW:     260  gm      0 lb 9 oz     22  %
----------------------------------------------------------------------
Gestational Age

 U/S Today:     18w 6d                                        EDD:   03/03/22
 Best:          19w 2d     Det. By:  U/S C R L  (08/18/21)    EDD:   02/28/22
----------------------------------------------------------------------
Anatomy

 Cranium:               Appears normal         Aortic Arch:            Appears normal
 Cavum:                 Not well visualized    Ductal Arch:            Appears normal
 Ventricles:            Appears normal         Diaphragm:              Appears normal
 Choroid Plexus:        Appears normal         Stomach:                Appears normal, left
                                                                       sided
 Cerebellum:            Appears normal         Abdomen:                Appears normal
 Posterior Fossa:       Appears normal         Abdominal Wall:         Appears nml (cord
                                                                       insert, abd wall)
 Nuchal Fold:           Appears normal         Cord Vessels:           Appears normal (3
                                                                       vessel cord)
 Face:                  Appears normal         Kidneys:                Appear normal
                        (orbits and profile)
 Lips:                  Appears normal         Bladder:                Appears normal
 Thoracic:              Appears normal         Spine:                  Not well visualized
 Heart:                 Not well visualized    Upper Extremities:      Appears normal
 RVOT:                  Appears normal         Lower Extremities:      Appears normal
 LVOT:                  Appears normal

 Other:  Fetus appears to be female. Nasal bone visualized. Lenses
         visualized. VC, 3VV and 3VTV visualized.
----------------------------------------------------------------------
Cervix Uterus Adnexa

 Cervix
 Length:           5.36  cm.
 Normal appearance by transabdominal scan.

 Adnexa
 No abnormality visualized.
----------------------------------------------------------------------
Impression
 G4 91SY1. Patient is here for fetal anatomy scan .
 On cell-free fetal DNA screening, the risks of fetal
 aneuploidies are not increased .MSAFP screening showed
 low risk for open-neural tube defects .

 Obstetric history significant for 2 term cesarean deliveries.

 We performed fetal anatomy scan. No makers of
 aneuploidies or fetal structural defects are seen. Fetal
 biometry is consistent with her previously-established dates.
 Amniotic fluid is normal and good fetal activity is seen.
 Patient understands the limitations of ultrasound in detecting
 fetal anomalies.
 Placenta is anterior and there is no evidence of previa or
 placenta accreta spectrum
----------------------------------------------------------------------
Recommendations

 -An appointment was made for her to return in 4 weeks for
 completion of fetal anatomy (including CSP).
 -Fetal growth assessment at 32 weeks and 36 weeks.
 -Weekly BPP from 36 weeks till delivery.
----------------------------------------------------------------------
                     Sorensen, Yonghoon
----------------------------------------------------------------------

*** End of Addendum ***\.br----------------------------------------------------------------------
----------------------------------------------------------------------

----------------------------------------------------------------------

----------------------------------------------------------------------

----------------------------------------------------------------------

----------------------------------------------------------------------
Indications

 Advanced maternal age multigravida 35+,
 second trimester (37y.o)
 Encounter for antenatal screening for
 malformations
 Previous cesarean delivery, antepartum (x2)
 Genetic carrier (sickle cell)
 19 weeks gestation of pregnancy
 LR NIPS/Neg AFP
----------------------------------------------------------------------
Fetal Evaluation

 Num Of Fetuses:         1
 Fetal Heart Rate(bpm):  162
 Cardiac Activity:       Observed
 Presentation:           Cephalic
 Placenta:               Anterior
 P. Cord Insertion:      Previously Visualized

 Amniotic Fluid
 AFI FV:      Within normal limits

                             Largest Pocket(cm)

----------------------------------------------------------------------
Biometry
 BPD:        42  mm     G. Age:  18w 5d         26  %    CI:         70.1   %    70 - 86
                                                         FL/HC:      17.1   %    16.1 -
 HC:       160   mm     G. Age:  18w 6d         22  %    HC/AC:      1.16        1.09 -
 AC:      138.3  mm     G. Age:  19w 2d         44  %    FL/BPD:     65.2   %
 FL:       27.4  mm     G. Age:  18w 3d         14  %    FL/AC:      19.8   %    20 - 24
 HUM:        27  mm     G. Age:  18w 4d         32  %
 CER:      17.9  mm     G. Age:  17w 6d        4.5  %
 NFT:       4.0  mm

 LV:        6.4  mm
 CM:        3.7  mm

 Est. FW:     260  gm      0 lb 9 oz     22  %
----------------------------------------------------------------------
Gestational Age

 U/S Today:     18w 6d                                        EDD:   03/03/22
 Best:          19w 2d     Det. By:  U/S C R L  (08/18/21)    EDD:   02/28/22
----------------------------------------------------------------------
Anatomy

 Cranium:               Appears normal         Aortic Arch:            Appears normal
 Cavum:                 Appears normal         Ductal Arch:            Appears normal
 Ventricles:            Appears normal         Diaphragm:              Appears normal
 Choroid Plexus:        Appears normal         Stomach:                Appears normal, left
                                                                       sided
 Cerebellum:            Appears normal         Abdomen:                Appears normal
 Posterior Fossa:       Appears normal         Abdominal Wall:         Appears nml (cord
                                                                       insert, abd wall)
 Nuchal Fold:           Appears normal         Cord Vessels:           Appears normal (3
                                                                       vessel cord)
 Face:                  Appears normal         Kidneys:                Appear normal
                        (orbits and profile)
 Lips:                  Appears normal         Bladder:                Appears normal
 Thoracic:              Appears normal         Spine:                  Not well visualized
 Heart:                 Not well visualized    Upper Extremities:      Appears normal
 RVOT:                  Appears normal         Lower Extremities:      Appears normal
 LVOT:                  Appears normal

 Other:  Fetus appears to be female. Nasal bone visualized. Lenses
         visualized. VC, 3VV and 3VTV visualized.
----------------------------------------------------------------------
Cervix Uterus Adnexa

 Cervix
 Length:           5.36  cm.
 Normal appearance by transabdominal scan.

 Adnexa
 No abnormality visualized.
----------------------------------------------------------------------
Impression
 G4 91SY1. Patient is here for fetal anatomy scan .
 On cell-free fetal DNA screening, the risks of fetal
 aneuploidies are not increased .MSAFP screening showed
 low risk for open-neural tube defects .

 Obstetric history significant for 2 term cesarean deliveries.

 We performed fetal anatomy scan. No makers of
 aneuploidies or fetal structural defects are seen. Fetal
 biometry is consistent with her previously-established dates.
 Amniotic fluid is normal and good fetal activity is seen.
 Patient understands the limitations of ultrasound in detecting
 fetal anomalies.
 Placenta is anterior and there is no evidence of previa or
 placenta accreta spectrum
----------------------------------------------------------------------
Recommendations

 -An appointment was made for her to return in 4 weeks for
 completion of fetal anatomy.
 -Fetal growth assessment at 32 weeks and 36 weeks.
 -Weekly BPP from 36 weeks till delivery.
----------------------------------------------------------------------
                Sorensen, Yonghoon
----------------------------------------------------------------------
